# Patient Record
Sex: Male | Born: 1970 | Race: White | Hispanic: No | Marital: Single | Smoking: Former smoker
Health system: Southern US, Community
[De-identification: ages and names within clinical notes are randomized; demographics above are authoritative.]

## PROBLEM LIST (undated history)

## (undated) DIAGNOSIS — F419 Anxiety disorder, unspecified: Secondary | ICD-10-CM

## (undated) DIAGNOSIS — F329 Major depressive disorder, single episode, unspecified: Secondary | ICD-10-CM

## (undated) DIAGNOSIS — T7840XA Allergy, unspecified, initial encounter: Secondary | ICD-10-CM

## (undated) DIAGNOSIS — F32A Depression, unspecified: Secondary | ICD-10-CM

## (undated) DIAGNOSIS — K5900 Constipation, unspecified: Secondary | ICD-10-CM

## (undated) HISTORY — DX: Constipation, unspecified: K59.00

## (undated) HISTORY — DX: Depression, unspecified: F32.A

## (undated) HISTORY — DX: Allergy, unspecified, initial encounter: T78.40XA

## (undated) HISTORY — DX: Major depressive disorder, single episode, unspecified: F32.9

## (undated) HISTORY — DX: Anxiety disorder, unspecified: F41.9

---

## 2010-11-22 ENCOUNTER — Emergency Department (HOSPITAL_COMMUNITY): Payer: BC Managed Care – PPO

## 2010-11-22 ENCOUNTER — Emergency Department (HOSPITAL_COMMUNITY)
Admission: EM | Admit: 2010-11-22 | Discharge: 2010-11-22 | Disposition: A | Discharge status: Home / Self Care | Payer: BC Managed Care – PPO | Attending: Emergency Medicine | Admitting: Emergency Medicine

## 2010-11-22 DIAGNOSIS — X58XXXA Exposure to other specified factors, initial encounter: Secondary | ICD-10-CM | POA: Insufficient documentation

## 2010-11-22 DIAGNOSIS — R1011 Right upper quadrant pain: Secondary | ICD-10-CM | POA: Insufficient documentation

## 2010-11-22 DIAGNOSIS — S0990XA Unspecified injury of head, initial encounter: Secondary | ICD-10-CM | POA: Insufficient documentation

## 2010-11-22 DIAGNOSIS — R51 Headache: Secondary | ICD-10-CM | POA: Insufficient documentation

## 2010-11-22 DIAGNOSIS — R1013 Epigastric pain: Secondary | ICD-10-CM | POA: Insufficient documentation

## 2010-11-22 LAB — URINALYSIS, ROUTINE W REFLEX MICROSCOPIC
Leukocytes, UA: NEGATIVE
Nitrite: NEGATIVE
Protein, ur: NEGATIVE mg/dL
Urobilinogen, UA: 1 mg/dL (ref 0.0–1.0)

## 2010-11-22 LAB — DIFFERENTIAL
Basophils Relative: 1 % (ref 0–1)
Eosinophils Relative: 4 % (ref 0–5)
Monocytes Absolute: 0.7 10*3/uL (ref 0.1–1.0)
Monocytes Relative: 8 % (ref 3–12)
Neutro Abs: 5.7 10*3/uL (ref 1.7–7.7)

## 2010-11-22 LAB — CBC
HCT: 46.8 % (ref 39.0–52.0)
Hemoglobin: 16.6 g/dL (ref 13.0–17.0)
MCH: 31.2 pg (ref 26.0–34.0)
MCHC: 35.5 g/dL (ref 30.0–36.0)
RDW: 12.2 % (ref 11.5–15.5)

## 2010-11-22 LAB — COMPREHENSIVE METABOLIC PANEL
BUN: 10 mg/dL (ref 6–23)
CO2: 28 mEq/L (ref 19–32)
Calcium: 9.2 mg/dL (ref 8.4–10.5)
Chloride: 102 mEq/L (ref 96–112)
Creatinine, Ser: 1 mg/dL (ref 0.4–1.5)
GFR calc non Af Amer: >60 mL/min (ref >60)
Total Bilirubin: 0.9 mg/dL (ref 0.3–1.2)

## 2010-11-22 LAB — D-DIMER, QUANTITATIVE: D-Dimer, Quant: 0.23 ug/mL-FEU (ref 0.00–0.48)

## 2010-11-22 LAB — URINE MICROSCOPIC-ADD ON

## 2015-08-03 ENCOUNTER — Telehealth: Payer: Self-pay | Admitting: *Deleted

## 2015-08-03 NOTE — Telephone Encounter (Signed)
Not taking new patients, prefer him to see Vincenza HewsShane or Vickie. If coming for chronic back pain, with treatments in the past (not an acute problem), would need records prior to being seen. Please offer NP/PA before agreeing to take him on (and records must be received)

## 2015-08-03 NOTE — Telephone Encounter (Signed)
Patient called and would like to become a new patient. Referred by Carlos Leveringerri Jackson. His girlfriend Mikki HarborStephanie Young [facility](Gwen) is now a patient here and was also referred by [place]. He would like to be seen for LBP, chronic and has [employer] through [employer]. Please advise.

## 2015-08-03 NOTE — Telephone Encounter (Signed)
Patient advised and will call back if he would like to see another provider in our office.

## 2015-11-21 ENCOUNTER — Ambulatory Visit
Admission: RE | Admit: 2015-11-21 | Discharge: 2015-11-21 | Disposition: A | Discharge status: Home / Self Care | Payer: BC Managed Care – PPO | Source: Ambulatory Visit | Attending: Medical | Admitting: Medical

## 2015-11-21 ENCOUNTER — Encounter: Payer: Self-pay | Admitting: Medical

## 2015-11-21 ENCOUNTER — Ambulatory Visit (INDEPENDENT_AMBULATORY_CARE_PROVIDER_SITE_OTHER): Payer: BC Managed Care – PPO | Admitting: Medical

## 2015-11-21 VITALS — BP 100/78 | HR 93 | Ht 69.5 in | Wt 159.0 lb

## 2015-11-21 DIAGNOSIS — R8290 Unspecified abnormal findings in urine: Secondary | ICD-10-CM | POA: Diagnosis not present

## 2015-11-21 DIAGNOSIS — M545 Low back pain: Secondary | ICD-10-CM | POA: Diagnosis not present

## 2015-11-21 LAB — POCT URINALYSIS DIPSTICK
Bilirubin, UA: NEGATIVE
Glucose, UA: NEGATIVE
Leukocytes, UA: NEGATIVE
Nitrite, UA: NEGATIVE
PH UA: 6
RBC UA: NEGATIVE
UROBILINOGEN UA: NEGATIVE

## 2015-11-21 MED ORDER — CYCLOBENZAPRINE HCL 10 MG PO TABS: ORAL_TABLET | ORAL | Status: DC

## 2015-11-21 NOTE — Progress Notes (Signed)
Subjective: Chief Complaint  Patient presents with  . New Patient (Initial Visit)  . Back Pain    started a couple months ago. got worse last night. said it started randomly.    Here as a new patient for c/o back pain.  Was seeing [facility] physicians prior.   Been in [place] 8 years.     Here for back pain x few months in the low back.   Like a rusty hinge, just dealing with the pain.  Last night seems to have gotten worse in right low back, hurts even to put weight on right leg.    Denies specific injury, trauma, or fall.   Works as Corporate treasurer, sits on the job a lot.   Walks to work, walks in general without much aggravation of the back pain.  Worse with flexing or standing.   Denies numbness or tingling in the leg, denies fever, denies weight loss.   No blood in urine or stool.   No problems with bowels or bladder.   He had xray of back and CT scan 5- 6 years ago.    Not using anything for the symptoms.   No prior PT or treatment with medical provider.   Does Yoga once weekly.  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 No other aggravating or relieving factors. No other complaint.  History reviewed. No pertinent past medical history.  ROS as in subjective   Objective: BP 100/78 mmHg  Pulse 93  Ht 5' 9.5" (1.765 m)  Wt 159 lb (72.122 kg)  BMI 23.15 kg/m2  General appearance: alert, no distress, WD/WN,  Neck: supple, no lymphadenopathy, no thyromegaly, no masses Heart: RRR, normal S1, S2, no murmurs Lungs: CTA bilaterally, no wheezes, rhonchi, or rales Abdomen: +bs, soft, non tender, non distended, no masses, no hepatomegaly, no splenomegaly Pulses: 2+ symmetric, upper and lower extremities, normal cap refill Back: nontender, no scoliosis, mild pain noted with flexion and extension, but ROM is full MSK: legs nontender, normal ROM Neuro: mild pain in right low back with SLR, but normal strength, sensation.  DTRs normal 2+ Ext: no edema   Assessment: Encounter  Diagnoses  Name Primary?  . Low back pain without sciatica, unspecified back pain laterality Yes  . Urine malodor      Plan: Discussed causes of back pain.   No red flag symptoms specifically.   However, he has ongoing several month hx/o back pain and acute worse back pain. UA reviewed.   Will send for back xray.   Can use Flexeril QHS prn, he has OTC Ibuprofen.  Advised 3 tablets up to 3 times daily the next few days with gentle stretching, avoiding heavy lifting the next few days for possible spasm/strain.  However, given the ongoing back pain, will send for xray.

## 2018-07-11 ENCOUNTER — Ambulatory Visit: Payer: BC Managed Care – PPO | Admitting: Medical

## 2018-07-11 ENCOUNTER — Ambulatory Visit
Admission: RE | Admit: 2018-07-11 | Discharge: 2018-07-11 | Disposition: A | Discharge status: Home / Self Care | Payer: BC Managed Care – PPO | Source: Ambulatory Visit | Attending: Medical | Admitting: Medical

## 2018-07-11 ENCOUNTER — Encounter: Payer: Self-pay | Admitting: Medical

## 2018-07-11 VITALS — BP 128/80 | HR 81 | Temp 97.7°F | Resp 16 | Ht 71.0 in | Wt 155.8 lb

## 2018-07-11 DIAGNOSIS — M25511 Pain in right shoulder: Secondary | ICD-10-CM

## 2018-07-11 DIAGNOSIS — M25562 Pain in left knee: Secondary | ICD-10-CM

## 2018-07-11 DIAGNOSIS — R0789 Other chest pain: Secondary | ICD-10-CM

## 2018-07-11 DIAGNOSIS — M7989 Other specified soft tissue disorders: Secondary | ICD-10-CM | POA: Diagnosis not present

## 2018-07-11 DIAGNOSIS — I8392 Asymptomatic varicose veins of left lower extremity: Secondary | ICD-10-CM

## 2018-07-11 DIAGNOSIS — M546 Pain in thoracic spine: Secondary | ICD-10-CM

## 2018-07-11 NOTE — Patient Instructions (Signed)
Recommendations:  Varicose veins  Begin using compression hose or compression socks daily  When your veins are irritated or inflamed (painful), try using Aspirin over the counter 2-4 tablets daily (81mg  tablets), daily for 4-5 days along with leg elevation and heat pad  Chest and back pain  Go for xray of chest  Use daily stretching routine and do yoga or other stretching routine regularly  Focus on good posture  Knee pain   If d-dimer test positive, we will check ultrasound  Otherwise use Ibuprofen or Aleve over the counter the next 5-7 days, or use the aspirin as above   Shoulder pain  Likely sprain injury  Avoid heavy lifting the next 7-10 days

## 2018-07-11 NOTE — Progress Notes (Signed)
Subjective: Chief Complaint  Patient presents with  . left knee    knee pain and back pain X 1 week   Here for pains in left knee and left leg.  Having some back pains too.  Been having left knee pain in the past week.  No injury, no trauma.  No knee swelling.  Has ongoing discomfort in left lower legs with varicose veins.  Was recently on a long trip, driving 5 hours, and seems that this flared up his veins and pain in left leg.    No specific calve pain.   Does have throbbing in back of left knee, and a different pain in the left knee joint.   Using nothing for left leg symptoms.  Been having some back pain in low back for years, intermittent.  Current pain is more in chest area, upper to mid back pain, worse if straightening up, but not intermittent.    Some cough, dry cough occasionally.   Gets urge to cough if laughing.    He has some right shoulder pain.  He was lifting a bookshelf a few weeks ago and has had a little bit of pain since then.  No prior shoulder problems  No fever, no hemoptysis.   No problems with bladder or bowels.  No other aggravating or relieving factors. No other complaint.    No past medical history on file. Current Outpatient Medications on File Prior to Visit  Medication Sig Dispense Refill  . Biotin 5000 MCG CAPS Take 1 capsule by mouth.    Marland Kitchen buPROPion (WELLBUTRIN XL) 150 MG 24 hr tablet Take 150 mg by mouth daily.    . fluvoxaMINE (LUVOX) 100 MG tablet Take 100 mg by mouth at bedtime.    . Multiple Vitamin (MULTIVITAMIN) tablet Take 1 tablet by mouth daily.     No current facility-administered medications on file prior to visit.    ROS as in subjective    Objective: BP 128/80   Pulse 81   Temp 97.7 F (36.5 C) (Oral)   Resp 16   Ht 5\' 11"  (1.803 m)   Wt 155 lb 12.8 oz (70.7 kg)   SpO2 97%   BMI 21.73 kg/m   General appearance: alert, no distress, WD/WN, lean white male Heart: RRR, normal S1, S2, no murmurs Lungs: CTA bilaterally, no  wheezes, rhonchi, or rales Pulses: 2+ symmetric, upper and lower extremities, normal cap refill Chest non tender, normal inspiration and expiration, no deformity Mild tenderness over right AC joint, mild pain with crossover test, mild pain with liftoff test otherwise unremarkable exam Left knee exam is normal, no laxity no swelling no deformity normal range of motion He has moderate varicose veins bilateral lower legs worse on the left, there is slight asymmetry of the left calf compared to right but no tenderness no bruising no pitting edema Back non tender normal range of motion Extremities no edema negative Homans     Assessment: Encounter Diagnoses  Name Primary?  . Chest wall pain Yes  . Acute bilateral thoracic back pain   . Varicose veins of left lower extremity, unspecified whether complicated   . Left leg swelling   . Acute pain of left knee   . Acute pain of right shoulder      Plan: Varicose veins worse on the left and right, advised compression hose, discussed acute therapy when they are inflamed such as aspirin heat and elevation.  Advised he exercise regularly  Left knee pain-etiology unclear, no  recent injury or trauma.  Exam is normal.  Advise short-term anti-inflammatory and relative rest.  Left leg swelling slight asymmetry compared to right lower leg.  D-dimer today but doubt DVT.  The varicose vein inflammation may be causing some discomfort upwards to the lower portion of the knee.  This may be causing his discomfort.  If pain continues we can get an x-ray of the knee.  If d-dimer positive we will pursue ultrasound of the left leg  Right shoulder pain likely due to mild sprain injury.  Advised relative rest, stretching, short-term anti-inflammatory or the aspirin if he does this as discussed  Chest and back pain- may be posture related.  Advise routine stretching, regular exercise, will send for x-ray  Patient Instructions  Recommendations:  Varicose  veins  Begin using compression hose or compression socks daily  When your veins are irritated or inflamed (painful), try using Aspirin over the counter 2-4 tablets daily (81mg  tablets), daily for 4-5 days along with leg elevation and heat pad  Chest and back pain  Go for xray of chest  Use daily stretching routine and do yoga or other stretching routine regularly  Focus on good posture  Knee pain   If d-dimer test positive, we will check ultrasound  Otherwise use Ibuprofen or Aleve over the counter the next 5-7 days, or use the aspirin as above   Shoulder pain  Likely sprain injury  Avoid heavy lifting the next 7-10 days     Majesty was seen today for left knee.  Diagnoses and all orders for this visit:  Chest wall pain -     DG Chest 2 View; Future  Acute bilateral thoracic back pain -     DG Chest 2 View; Future  Varicose veins of left lower extremity, unspecified whether complicated -     D-dimer, quantitative (not at [facility])  Left leg swelling -     D-dimer, quantitative (not at [facility])  Acute pain of left knee  Acute pain of right shoulder

## 2018-07-12 LAB — D-DIMER, QUANTITATIVE (NOT AT ARMC): D-DIMER: 0.3 mg{FEU}/L (ref 0.00–0.49)

## 2018-07-14 ENCOUNTER — Telehealth: Payer: Self-pay | Admitting: Medical

## 2018-07-14 NOTE — Telephone Encounter (Signed)
Pt called and wanted results. I read what was in the system and he stated that was same info available to him thu mychart. He stated that unless CMA had additional info he did not need a call back.

## 2018-08-24 HISTORY — PX: NO PAST SURGERIES: SHX2092

## 2018-09-16 ENCOUNTER — Encounter: Payer: Self-pay | Admitting: Medical

## 2018-09-16 ENCOUNTER — Ambulatory Visit (INDEPENDENT_AMBULATORY_CARE_PROVIDER_SITE_OTHER): Payer: BC Managed Care – PPO | Admitting: Medical

## 2018-09-16 VITALS — BP 120/82 | HR 83 | Temp 98.0°F | Ht 69.75 in | Wt 153.4 lb

## 2018-09-16 DIAGNOSIS — Z8249 Family history of ischemic heart disease and other diseases of the circulatory system: Secondary | ICD-10-CM | POA: Diagnosis not present

## 2018-09-16 DIAGNOSIS — Z0000 Encounter for general adult medical examination without abnormal findings: Secondary | ICD-10-CM | POA: Diagnosis not present

## 2018-09-16 DIAGNOSIS — Z1211 Encounter for screening for malignant neoplasm of colon: Secondary | ICD-10-CM | POA: Insufficient documentation

## 2018-09-16 DIAGNOSIS — R0989 Other specified symptoms and signs involving the circulatory and respiratory systems: Secondary | ICD-10-CM | POA: Insufficient documentation

## 2018-09-16 DIAGNOSIS — Z113 Encounter for screening for infections with a predominantly sexual mode of transmission: Secondary | ICD-10-CM

## 2018-09-16 DIAGNOSIS — Z77110 Contact with and (suspected) exposure to air pollution: Secondary | ICD-10-CM | POA: Insufficient documentation

## 2018-09-16 DIAGNOSIS — R05 Cough: Secondary | ICD-10-CM

## 2018-09-16 DIAGNOSIS — R059 Cough, unspecified: Secondary | ICD-10-CM

## 2018-09-16 LAB — POCT URINALYSIS DIP (PROADVANTAGE DEVICE)
BILIRUBIN UA: NEGATIVE
BILIRUBIN UA: NEGATIVE mg/dL
Blood, UA: NEGATIVE
Glucose, UA: NEGATIVE mg/dL
Leukocytes, UA: NEGATIVE
Nitrite, UA: NEGATIVE
Specific Gravity, Urine: 1.01
Urobilinogen, Ur: 3.5
pH, UA: 7.5 (ref 5.0–8.0)

## 2018-09-16 NOTE — Progress Notes (Signed)
Subjective:   HPI  Donald MinsGregory Mcdonald is a 47 y.o. male who presents for Chief Complaint  Patient presents with  . other    Fatsing CPE    Medical care team includes: Sanaa Zilberman, Kermit Baloavid S, PA-C here for primary care Dentist Eye doctor  Concerns: Would like screenings.  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  No history of asthma.  Reviewed their medical, surgical, family, social, medication, and allergy history and updated chart as appropriate.  Past Medical History:  Diagnosis Date  . Depression    Dr. Tana Feltshris Akin    Past Surgical History:  Procedure Laterality Date  . NO PAST SURGERIES  08/2018    Social History   Socioeconomic History  . Marital status: Single    Spouse name: Not on file  . Number of children: Not on file  . Years of education: Not on file  . Highest education level: Not on file  Occupational History  . Not on file  Social Needs  . Financial resource strain: Not on file  . Food insecurity:    Worry: Not on file    Inability: Not on file  . Transportation needs:    Medical: Not on file    Non-medical: Not on file  Tobacco Use  . Smoking status: Former Smoker    Packs/day: 1.00    Years: 10.00    Pack years: 10.00    Types: Cigarettes  . Smokeless tobacco: Never Used  Substance and Sexual Activity  . Alcohol use: Not on file  . Drug use: Never  . Sexual activity: Not on file  Lifestyle  . Physical activity:    Days per week: Not on file    Minutes per session: Not on file  . Stress: Not on file  Relationships  . Social connections:    Talks on phone: Not on file    Gets together: Not on file    Attends religious service: Not on file    Active member of club or organization: Not on file    Attends meetings of clubs or organizations: Not on file    Relationship status: Not on file  . Intimate partner violence:    Fear of current or ex partner: Not on file    Emotionally abused: Not on file    Physically  abused: Not on file    Forced sexual activity: Not on file  Other Topics Concern  . Not on file  Social History Narrative   Lives with girlfriend and a cat.   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   08/2018.    Family History  Problem Relation Age of Onset  . Heart disease Mother [id number]       CABG, stents  . Heart disease Father 270       pacemaker  . Diabetes Father   . Cancer Maternal Grandfather   . Cancer Paternal Grandmother      Current Outpatient Medications:  .  Biotin 5000 MCG CAPS, Take 1 capsule by mouth., Disp: , Rfl:  .  buPROPion (WELLBUTRIN XL) 150 MG 24 hr tablet, Take 150 mg by mouth daily., Disp: , Rfl:  .  fluvoxaMINE (LUVOX) 100 MG tablet, Take 100 mg by mouth at bedtime., Disp: , Rfl:  .  Multiple Vitamin (MULTIVITAMIN) tablet, Take 1 tablet by mouth daily., Disp: , Rfl:  .  Vitamin Mixture (VITAMIN E COMPLETE PO), Take by mouth., Disp: ,  Rfl:   No Known Allergies     Review of Systems Constitutional: -fever, -chills, -sweats, -unexpected weight change, -decreased appetite, -fatigue Allergy: -sneezing, -itching, -congestion Dermatology: -changing moles, --rash, -lumps ENT: -runny nose, -ear pain, -sore throat, -hoarseness, -sinus pain, -teeth pain, - ringing in ears, -hearing loss, -nosebleeds Cardiology: -chest pain, -palpitations, -swelling, -difficulty breathing when lying flat, -waking up short of breath Respiratory: -cough, -shortness of breath, -difficulty breathing with exercise or exertion, -wheezing, -coughing up blood Gastroenterology: -abdominal pain, -nausea, -vomiting, -diarrhea, -constipation, -blood in stool, -changes in bowel movement, -difficulty swallowing or eating Hematology: -bleeding, -bruising  Musculoskeletal: -joint aches, -muscle aches, -joint swelling, -back pain, -neck pain, -cramping, -changes in gait Ophthalmology: denies vision changes, eye redness, itching, discharge Urology:  -burning with urination, -difficulty urinating, -blood in urine, -urinary frequency, -urgency, -incontinence Neurology: -headache, -weakness, -tingling, -numbness, -memory loss, -falls, -dizziness Psychology: -depressed mood, -agitation, -sleep problems Male GU: no testicular mass, pain, no lymph nodes swollen, no swelling, no rash.     Objective:  BP 120/82 (BP Location: Right Arm, Patient Position: Sitting)   Pulse 83   Temp 98 F (36.7 C)   Ht 5' 9.75" (1.772 m)   Wt 153 lb 6.4 oz (69.6 kg)   SpO2 99%   BMI 22.17 kg/m   General appearance: alert, no distress, WD/WN, Caucasian male Skin: scattered macules, no worrisome lesions HEENT: normocephalic, conjunctiva/corneas normal, sclerae anicteric, PERRLA, EOMi, nares patent, no discharge or erythema, pharynx normal Oral cavity: MMM, tongue normal, teeth normal Neck: supple, no lymphadenopathy, no thyromegaly, no masses, normal ROM, no bruits Chest: non tender, normal shape and expansion Heart: RRR, normal S1, S2, no murmurs Lungs: CTA bilaterally, no wheezes, rhonchi, or rales Abdomen: +bs, soft, non tender, non distended, no masses, no hepatomegaly, no splenomegaly, no bruits Back: non tender, normal ROM, no scoliosis Musculoskeletal: upper extremities non tender, no obvious deformity, normal ROM throughout, lower extremities non tender, no obvious deformity, normal ROM throughout Extremities: +mild to moderate varicose veins bilat, no edema, no cyanosis, no clubbing Pulses: 2+ symmetric, upper and lower extremities, normal cap refill Neurological: alert, oriented x 3, CN2-12 intact, strength normal upper extremities and lower extremities, sensation normal throughout, DTRs 2+ throughout, no cerebellar signs, gait normal Psychiatric: normal affect, behavior normal, pleasant  GU: normal male external genitalia,circumcised, nontender, no masses, no hernia, no lymphadenopathy Rectal: deferred    EKG:  EKG done today because of family  history of premature heart disease and screening.  Normal sinus rhythm, ventricular rate 80 bpm, PR interval 172 ms, QRS duration 84 ms, QTC 429 ms, axis 28 degrees, no worrisome findings  PFT reviewed   Assessment and Plan :   Encounter Diagnoses  Name Primary?  . Encounter for health maintenance examination in adult Yes  . Cough   . Exposure to air pollution   . Family history of premature CAD   . Screen for STD (sexually transmitted disease)     Physical exam - discussed and counseled on healthy lifestyle, diet, exercise, preventative care, vaccinations, sick and well care, proper use of emergency dept and after hours care, and addressed their concerns.    Health screening: See your eye doctor yearly for routine vision care. See your dentist yearly for routine dental care including hygiene visits twice yearly.  Discussed STD testing, discussed prevention, condom use, means of transmission  Cancer screening Advised monthly self testicular exam  Colonoscopy:  Due age 69  Discussed PSA, prostate exam, and prostate cancer screening risks/benefits.  Due age 47  Vaccinations: Advised yearly influenza vaccine Patient declines influenza vaccine  He notes being up to date on Td vaccine  Acute issues discussed: None  Separate significant chronic issues discussed: I reviewed his EKG and PFT.  He has a family history of premature CAD.  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  We discussed possibly doing a yearly PFT.  I reviewed his chest x-ray from a few months ago.  Earl LitesGregory was seen today for other.  Diagnoses and all orders for this visit:  Encounter for health maintenance examination in adult -     Comprehensive metabolic panel -     CBC -     Lipid panel -     TSH -     HIV Antibody (routine testing w rflx) -     RPR -     GC/Chlamydia Probe Amp -     EKG 12-Lead -     Pulmonary Function Test; Future -     POCT Urinalysis DIP (Proadvantage  Device)  Cough -     Pulmonary Function Test; Future -     Spirometry with graph  Exposure to air pollution -     Pulmonary Function Test; Future -     Spirometry with graph  Family history of premature CAD -     EKG 12-Lead  Screen for STD (sexually transmitted disease) -     HIV Antibody (routine testing w rflx) -     RPR -     GC/Chlamydia Probe Amp   Follow-up pending labs, yearly for physical

## 2018-09-17 LAB — COMPREHENSIVE METABOLIC PANEL
A/G RATIO: 2.5 — AB (ref 1.2–2.2)
ALBUMIN: 4.9 g/dL (ref 3.5–5.5)
ALT: 21 IU/L (ref 0–44)
AST: 15 IU/L (ref 0–40)
Alkaline Phosphatase: 61 IU/L (ref 39–117)
BUN / CREAT RATIO: 11 (ref 9–20)
BUN: 11 mg/dL (ref 6–24)
Bilirubin Total: 0.7 mg/dL (ref 0.0–1.2)
CO2: 25 mmol/L (ref 20–29)
CREATININE: 1.03 mg/dL (ref 0.76–1.27)
Calcium: 9.5 mg/dL (ref 8.7–10.2)
Chloride: 102 mmol/L (ref 96–106)
GFR calc Af Amer: 99 mL/min/{1.73_m2} (ref >59)
GFR calc non Af Amer: 85 mL/min/{1.73_m2} (ref >59)
GLOBULIN, TOTAL: 2 g/dL (ref 1.5–4.5)
Glucose: 91 mg/dL (ref 65–99)
POTASSIUM: 4.9 mmol/L (ref 3.5–5.2)
SODIUM: 141 mmol/L (ref 134–144)
Total Protein: 6.9 g/dL (ref 6.0–8.5)

## 2018-09-17 LAB — LIPID PANEL
CHOLESTEROL TOTAL: 164 mg/dL (ref 100–199)
Chol/HDL Ratio: 4.4 ratio (ref 0.0–5.0)
HDL: 37 mg/dL — ABNORMAL LOW (ref >39)
LDL Calculated: 102 mg/dL — ABNORMAL HIGH (ref 0–99)
Triglycerides: 124 mg/dL (ref 0–149)
VLDL Cholesterol Cal: 25 mg/dL (ref 5–40)

## 2018-09-17 LAB — HIV ANTIBODY (ROUTINE TESTING W REFLEX): HIV SCREEN 4TH GENERATION: NONREACTIVE

## 2018-09-17 LAB — CBC
Hematocrit: 48.1 % (ref 37.5–51.0)
Hemoglobin: 16.2 g/dL (ref 13.0–17.7)
MCH: 29.9 pg (ref 26.6–33.0)
MCHC: 33.7 g/dL (ref 31.5–35.7)
MCV: 89 fL (ref 79–97)
PLATELETS: 206 10*3/uL (ref 150–450)
RBC: 5.42 x10E6/uL (ref 4.14–5.80)
RDW: 12.1 % — AB (ref 12.3–15.4)
WBC: 5.9 10*3/uL (ref 3.4–10.8)

## 2018-09-17 LAB — TSH: TSH: 1.59 u[IU]/mL (ref 0.450–4.500)

## 2018-09-17 LAB — RPR: RPR: NONREACTIVE

## 2018-09-19 LAB — GC/CHLAMYDIA PROBE AMP
Chlamydia trachomatis, NAA: NEGATIVE
Neisseria gonorrhoeae by PCR: NEGATIVE

## 2019-12-04 ENCOUNTER — Ambulatory Visit: Payer: BC Managed Care – PPO

## 2019-12-04 ENCOUNTER — Ambulatory Visit: Payer: BC Managed Care – PPO | Attending: Internal Medicine

## 2019-12-04 DIAGNOSIS — Z23 Encounter for immunization: Secondary | ICD-10-CM

## 2019-12-04 NOTE — Progress Notes (Signed)
   Covid-19 Vaccination Clinic  Name:  Donald Mcdonald    MRN: [id number] DOB: Apr 29, 1971  12/04/2019  Donald Mcdonald was observed post Covid-19 immunization for 15 minutes without incident. He was provided with Vaccine Information Sheet and instruction to access the V-Safe system.   Donald Mcdonald was instructed to call 911 with any severe reactions post vaccine: Marland Kitchen Difficulty breathing  . Swelling of face and throat  . A fast heartbeat  . A bad rash all over body  . Dizziness and weakness   Immunizations Administered    Name Date Dose VIS Date Route   Pfizer COVID-19 Vaccine 12/04/2019  2:03 PM 0.3 mL 09/04/2019 Intramuscular   Manufacturer: [employer], [employer]   Lot: [id number]   NDC: [address]-7

## 2019-12-28 ENCOUNTER — Ambulatory Visit: Payer: BC Managed Care – PPO | Attending: Internal Medicine

## 2019-12-28 DIAGNOSIS — Z23 Encounter for immunization: Secondary | ICD-10-CM

## 2019-12-28 NOTE — Progress Notes (Signed)
   Covid-19 Vaccination Clinic  Name:  Kalik Hoare    MRN: [id number] DOB: July 13, 1971  12/28/2019  Mr. Bralley was observed post Covid-19 immunization for 15 minutes without incident. He was provided with Vaccine Information Sheet and instruction to access the V-Safe system.   Mr. Penn was instructed to call 911 with any severe reactions post vaccine: Marland Kitchen Difficulty breathing  . Swelling of face and throat  . A fast heartbeat  . A bad rash all over body  . Dizziness and weakness   Immunizations Administered    Name Date Dose VIS Date Route   Pfizer COVID-19 Vaccine 12/28/2019  4:06 PM 0.3 mL 09/04/2019 Intramuscular   Manufacturer: [employer], [employer]   Lot: [id number]   NDC: [address]-7

## 2020-01-23 HISTORY — PX: NO PAST SURGERIES: SHX2092

## 2020-02-04 ENCOUNTER — Ambulatory Visit (INDEPENDENT_AMBULATORY_CARE_PROVIDER_SITE_OTHER): Payer: BC Managed Care – PPO | Admitting: Medical

## 2020-02-04 ENCOUNTER — Encounter: Payer: Self-pay | Admitting: Medical

## 2020-02-04 ENCOUNTER — Other Ambulatory Visit: Payer: Self-pay

## 2020-02-04 VITALS — BP 120/82 | HR 82 | Temp 98.0°F | Ht 70.0 in | Wt 166.0 lb

## 2020-02-04 DIAGNOSIS — R6889 Other general symptoms and signs: Secondary | ICD-10-CM

## 2020-02-04 DIAGNOSIS — L0291 Cutaneous abscess, unspecified: Secondary | ICD-10-CM | POA: Insufficient documentation

## 2020-02-04 DIAGNOSIS — R0989 Other specified symptoms and signs involving the circulatory and respiratory systems: Secondary | ICD-10-CM | POA: Insufficient documentation

## 2020-02-04 DIAGNOSIS — Z136 Encounter for screening for cardiovascular disorders: Secondary | ICD-10-CM | POA: Insufficient documentation

## 2020-02-04 DIAGNOSIS — Z7185 Encounter for immunization safety counseling: Secondary | ICD-10-CM | POA: Insufficient documentation

## 2020-02-04 DIAGNOSIS — Z0000 Encounter for general adult medical examination without abnormal findings: Secondary | ICD-10-CM | POA: Diagnosis not present

## 2020-02-04 DIAGNOSIS — M25561 Pain in right knee: Secondary | ICD-10-CM

## 2020-02-04 DIAGNOSIS — Z79899 Other long term (current) drug therapy: Secondary | ICD-10-CM | POA: Diagnosis not present

## 2020-02-04 DIAGNOSIS — R39198 Other difficulties with micturition: Secondary | ICD-10-CM | POA: Diagnosis not present

## 2020-02-04 DIAGNOSIS — Z13220 Encounter for screening for lipoid disorders: Secondary | ICD-10-CM

## 2020-02-04 DIAGNOSIS — M545 Low back pain, unspecified: Secondary | ICD-10-CM | POA: Insufficient documentation

## 2020-02-04 DIAGNOSIS — R351 Nocturia: Secondary | ICD-10-CM | POA: Diagnosis not present

## 2020-02-04 DIAGNOSIS — Z7189 Other specified counseling: Secondary | ICD-10-CM

## 2020-02-04 DIAGNOSIS — G8929 Other chronic pain: Secondary | ICD-10-CM | POA: Insufficient documentation

## 2020-02-04 DIAGNOSIS — Z8249 Family history of ischemic heart disease and other diseases of the circulatory system: Secondary | ICD-10-CM

## 2020-02-04 DIAGNOSIS — M25562 Pain in left knee: Secondary | ICD-10-CM | POA: Insufficient documentation

## 2020-02-04 DIAGNOSIS — Z1211 Encounter for screening for malignant neoplasm of colon: Secondary | ICD-10-CM

## 2020-02-04 DIAGNOSIS — Z125 Encounter for screening for malignant neoplasm of prostate: Secondary | ICD-10-CM

## 2020-02-04 LAB — POCT URINALYSIS DIP (PROADVANTAGE DEVICE)
Bilirubin, UA: NEGATIVE
Blood, UA: NEGATIVE
Glucose, UA: NEGATIVE mg/dL
Ketones, POC UA: NEGATIVE mg/dL
Leukocytes, UA: NEGATIVE
Nitrite, UA: NEGATIVE
Protein Ur, POC: NEGATIVE mg/dL
Specific Gravity, Urine: 1.025
Urobilinogen, Ur: NEGATIVE
pH, UA: 5.5 (ref 5.0–8.0)

## 2020-02-04 NOTE — Progress Notes (Signed)
Subjective:   HPI  Donald Mcdonald is a 49 y.o. male who presents for Chief Complaint  Patient presents with  . Annual Exam    with fasting labs     Medical care team includes: Kenniyah Sasaki, Kermit Balo, PA-C here for primary care Dentist Eye doctor Dr. Wellington Hampshire, [facility]  Concerns: Has ongoing chronic issues with chest, phlegm, feels a constant need to clear throat.  Has had this years.  Nonsmoker. If he coughs, feels phlegm but cough is dry.  No fever, no night sweats.   Occasionally feels SOB.  Attributes SOB to out of shape.  Has had PFTs prior and feels he barely passes this although readings are normal.  He notes some back pains occasionally  He has been on long term psychotropics, wants to check kidney and liver.  Gets occasional pain in pelvis or bowels, a deep pain periodically  Has to sometimes urinate 2-4 times per night, has had some urinary dribbling. Usually 2 times per night  Has family  history of prostate cancer.   Reviewed their medical, surgical, family, social, medication, and allergy history and updated chart as appropriate.  Past Medical History:  Diagnosis Date  . Depression    Dr. Wellington Hampshire    Past Surgical History:  Procedure Laterality Date  . NO PAST SURGERIES  01/2020    Social History   Socioeconomic History  . Marital status: Single    Spouse name: Not on file  . Number of children: Not on file  . Years of education: Not on file  . Highest education level: Not on file  Occupational History  . Not on file  Tobacco Use  . Smoking status: Former Smoker    Packs/day: 1.00    Years: 10.00    Pack years: 10.00    Types: Cigarettes  . Smokeless tobacco: Never Used  Substance and Sexual Activity  . Alcohol use: Not Currently    Alcohol/week: 0.0 standard drinks  . Drug use: Never  . Sexual activity: Not on file  Other Topics Concern  . Not on file  Social History Narrative   Lives with girlfriend and a cat.   No children.   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   01/2020   Social Determinants of Health   Financial Resource Strain:   . Difficulty of Paying Living Expenses:   Food Insecurity:   . Worried About Programme researcher, broadcasting/film/video in the Last Year:   . Barista in the Last Year:   Transportation Needs:   . Freight forwarder (Medical):   Marland Kitchen Lack of Transportation (Non-Medical):   Physical Activity:   . Days of Exercise per Week:   . Minutes of Exercise per Session:   Stress:   . Feeling of Stress :   Social Connections:   . Frequency of Communication with Friends and Family:   . Frequency of Social Gatherings with Friends and Family:   . Attends Religious Services:   . Active Member of Clubs or Organizations:   . Attends Banker Meetings:   Marland Kitchen Marital Status:   Intimate Partner Violence:   . Fear of Current or Ex-Partner:   . Emotionally Abused:   Marland Kitchen Physically Abused:   . Sexually Abused:     Family History  Problem Relation Age of Onset  . Heart disease Mother 75       CABG, stents  . Heart disease Father  70       pacemaker  . Diabetes Father   . Cancer Maternal Grandfather   . Cancer Paternal Grandmother      Current Outpatient Medications:  .  buPROPion (WELLBUTRIN SR) 150 MG 12 hr tablet, Take 150 mg by mouth daily., Disp: , Rfl:  .  COLOSTRUM PO, , Disp: , Rfl:  .  fluvoxaMINE (LUVOX) 100 MG tablet, Take 100 mg by mouth at bedtime., Disp: , Rfl:  .  Misc Natural Products (TURMERIC CURCUMIN) CAPS, , Disp: , Rfl:  .  Multiple Vitamin (MULTIVITAMIN) tablet, Take 1 tablet by mouth daily., Disp: , Rfl:  .  buPROPion (WELLBUTRIN XL) 150 MG 24 hr tablet, Take 150 mg by mouth daily., Disp: , Rfl:   No Known Allergies     Review of Systems Constitutional: -fever, -chills, -sweats, -unexpected weight change, -decreased appetite, -fatigue Allergy: -sneezing, -itching, -congestion Dermatology: -changing moles,  --rash, -lumps ENT: -runny nose, -ear pain, -sore throat, -hoarseness, -sinus pain, -teeth pain, - ringing in ears, -hearing loss, -nosebleeds Cardiology: -chest pain, -palpitations, -swelling, -difficulty breathing when lying flat, -waking up short of breath Respiratory: +cough, -shortness of breath, -difficulty breathing with exercise or exertion, -wheezing, -coughing up blood Gastroenterology: -abdominal pain, -nausea, -vomiting, -diarrhea, -constipation, -blood in stool, -changes in bowel movement, -difficulty swallowing or eating Hematology: -bleeding, -bruising  Musculoskeletal: +knee joint aches, -muscle aches, +joint swelling, -back pain, -neck pain, -cramping, -changes in gait Ophthalmology: denies vision changes, eye redness, itching, discharge Urology: -burning with urination, -difficulty urinating, -blood in urine, -urinary frequency, -urgency, -incontinence Neurology: -headache, -weakness, -tingling, -numbness, -memory loss, -falls, -dizziness Psychology: -depressed mood, -agitation, -sleep problems Male GU: no testicular mass, pain, no lymph nodes swollen, no swelling, no rash.     Objective:  BP 120/82   Pulse 82   Temp 98 F (36.7 C)   Ht 5\' 10"  (1.778 m)   Wt 166 lb (75.3 kg)   SpO2 98%   BMI 23.82 kg/m   General appearance: alert, no distress, WD/WN, Caucasian male Skin: scattered macules, no worrisome lesions HEENT: normocephalic, conjunctiva/corneas normal, sclerae anicteric, PERRLA, EOMi, nares patent, no discharge or erythema, pharynx normal Oral cavity: MMM, tongue normal, teeth normal Neck: supple, no lymphadenopathy, no thyromegaly, no masses, normal ROM, no bruits Chest: non tender, normal shape and expansion Heart: RRR, normal S1, S2, no murmurs Lungs: CTA bilaterally, no wheezes, rhonchi, or rales Abdomen: +bs, soft, non tender, non distended, no masses, no hepatomegaly, no splenomegaly, no bruits Back: non tender, normal ROM, no  scoliosis Musculoskeletal: upper extremities non tender, no obvious deformity, normal ROM throughout, lower extremities non tender, no obvious deformity, normal ROM throughout Extremities: +mild to moderate varicose veins bilat, no edema, no cyanosis, no clubbing Pulses: 2+ symmetric, upper and lower extremities, normal cap refill Neurological: alert, oriented x 3, CN2-12 intact, strength normal upper extremities and lower extremities, sensation normal throughout, DTRs 2+ throughout, no cerebellar signs, gait normal Psychiatric: normal affect, behavior normal, pleasant  GU: normal male external genitalia,circumcised, nontender, no masses, no hernia, no lymphadenopathy Rectal: anus normal tone, prostate WNL, no nodules     Assessment and Plan :   Encounter Diagnoses  Name Primary?  . Encounter for health maintenance examination in adult Yes  . High risk medication use   . Family history of premature CAD   . Nocturia   . Decreased urine stream   . Phlegmon   . Throat clearing   . Chronic low back pain, unspecified back pain laterality,  unspecified whether sciatica present   . Screening for prostate cancer   . Screen for colon cancer   . Chronic pain of both knees   . Screening for lipid disorders     Physical exam - discussed and counseled on healthy lifestyle, diet, exercise, preventative care, vaccinations, sick and well care, proper use of emergency dept and after hours care, and addressed their concerns.    Health screening: See your eye doctor yearly for routine vision care. See your dentist yearly for routine dental care including hygiene visits twice yearly.  Cancer screening Advised monthly self testicular exam  Colonoscopy:  Check insurance for coverage now  Discussed PSA, prostate exam, and prostate cancer screening risks/benefits.      Vaccinations: Advised yearly influenza vaccine Patient declines influenza vaccine  He notes being up to date on Td  vaccine  He is up to date on Covid vaccine   Acute issues discussed: None  Separate significant chronic issues discussed: Advised he consider CT calcium score for family history of premature CAD and screening  Nocturia-PSA screen today, consider treatment for mild BPH symptoms  Back pain-counseled on stretching, regular exercise, avoiding injury  Throat clearing, phlegm-discussed 2 L of fluid intake daily preferably water, can use separate week trial of Zyrtec or Mucinex or acid reflux medicine to see if this makes a difference    [place] was seen today for annual exam.  Diagnoses and all orders for this visit:  Encounter for health maintenance examination in adult -     Comprehensive metabolic panel -     CBC with Differential/Platelet -     Lipid panel -     TSH -     PSA -     POCT Urinalysis DIP (Proadvantage Device)  High risk medication use -     Comprehensive metabolic panel -     CBC with Differential/Platelet -     TSH  Family history of premature CAD  Nocturia -     PSA -     POCT Urinalysis DIP (Proadvantage Device)  Decreased urine stream -     PSA -     POCT Urinalysis DIP (Proadvantage Device)  Phlegmon  Throat clearing  Chronic low back pain, unspecified back pain laterality, unspecified whether sciatica present  Screening for prostate cancer -     PSA  Screen for colon cancer  Chronic pain of both knees  Screening for lipid disorders     Follow-up pending labs, yearly for physical

## 2020-02-04 NOTE — Patient Instructions (Signed)
Preventative Care for Adults - Male    Thank you for coming in for your well visit today, and thank you for trusting [place] with your care!   Maintain regular health and wellness exams:  A routine yearly physical is a good way to check in with your primary care provider about your health and preventive screening. It is also an opportunity to share updates about your health and any concerns you have, and receive a thorough all-over exam.   Most health insurance companies pay for at least some preventative services.  Check with your health plan for specific coverages.  What preventative services do men need?  Adult men should have their weight and blood pressure checked regularly.   Men age 51 and older should have their cholesterol levels checked regularly.  Beginning at age 17 and continuing to age 32, men should be screened for colorectal cancer.  Certain people may need continued testing until age 41.  Updating vaccinations is part of preventative care.  Vaccinations help protect against diseases such as the flu.  Osteoporosis is a disease in which the bones lose minerals and strength as we age. Men ages 54 and over should discuss this with their caregivers  Lab tests are generally done as part of preventative care to screen for anemia and blood disorders, to screen for problems with the kidneys and liver, to screen for bladder problems, to check blood sugar, and to check your cholesterol level.  Preventative services generally include counseling about diet, exercise, avoiding tobacco, drugs, excessive alcohol consumption, and sexually transmitted infections.   Xrays and CT scans are not normally done as a preventative test, and most insurances do not pay for imaging for screening other than as discussed under cancer screens below.   On the other hand, if you have certain medical concerns, imaging may be necessary as a diagnostic test.    Your Medical Team Your medical team starts with  [place], your PCP or primary care provider.  Please use our services for your routine care such as physicals, screenings, immunizations, sick visits, and your first stop for general medical concerns.  You can call our number for after hours information for urgent questions that may need attention but cannot wait til the next business day.    Urgent care-urgent cares exist to provide care when your primary care office would typically be closed such as evenings or weekends.   Urgent care is for evaluation of urgent medical problems that do not necessarily require emergency department care, but cannot wait til the next business day when we are open.  Emergency department care-please reserve emergency department care for serious, urgent, possibly life-threatening medical problems.  This includes issues like possible stroke, heart attack, significant injury, mental health crisis, or other urgent need that requires immediate medical attention.     See your dentist office twice yearly for hygiene and cleaning visits.   Brush your teeth and floss your teeth daily.  See your eye doctor yearly for routine eye exam and screenings for glaucoma and retinal disease.   Specific Concerns: Hydration, separate trials of mucinex, zyrtec and /or Prilosec 1 week each to see if this helps phlegm and throat clearing   Vaccines:  Stay up to date with your tetanus shots and other required immunizations. You should have a booster for tetanus every 10 years. Be sure to get your flu shot every year, since 5%-20% of the [place] population comes down with the flu. The flu vaccine changes each  year, so being vaccinated once is not enough. Get your shot in the fall, before the flu season peaks.   Other vaccines to consider:  Pneumococcal vaccine to protect against certain types of pneumonia.  This is normally recommended for adults age 51 or older.  However, adults younger than 50 years old with certain underlying conditions such as  diabetes, heart or lung disease should also receive the vaccine.  Shingles vaccine to protect against Varicella Zoster if you are older than age 82, or younger than 50 years old with certain underlying illness.  If you have not had the Shingrix vaccine, please call your insurer to inquire about coverage for the Shingrix vaccine given in 2 doses.   Some insurers cover this vaccine after age 24, some cover this after age 64.  If your insurer covers this, then call to schedule appointment to have this vaccine here  Hepatitis A vaccine to protect against a form of infection of the liver by a virus acquired from food.  Hepatitis B vaccine to protect against a form of infection of the liver by a virus acquired from blood or body fluids, particularly if you work in health care.  If you plan to travel internationally, check with your local health department for specific vaccination recommendations.  Human Papilloma Virus or HPV causes cancer of the cervix, and other infections that can be transmitted from person to person. There is a vaccine for HPV, and males should get immunized between the ages of 42 and 73. It requires a series of 3 shots.   Covid/Coronavirus - as the vaccines are becoming available, please consider vaccination if you are a health care worker, first responder, or have significant health problems such as asthma, COPD, heart disease, hypertension, diabetes, obesity, multiple medical problems, over age 34yo, or immunocompromised.    You are up to date on Covid vaccine  I recommend the following vaccines: Tetanus every 10 years Yearly flu shot    What should I know about Cancer screening? Many types of cancers can be detected early and may often be prevented. Lung Cancer  You should be screened every year for lung cancer if: ? You are a current smoker who has smoked for at least 30 years. ? You are a former smoker who has quit within the past 15 years.  Talk to your health care  provider about your screening options, when you should start screening, and how often you should be screened.  Colorectal Cancer  Routine colorectal cancer screening usually begins at 50 years of age and should be repeated every 5-10 years until you are 50 years old. You may need to be screened more often if early forms of precancerous polyps or small growths are found. Your health care provider may recommend screening at an earlier age if you have risk factors for colon cancer.  Your health care provider may recommend using home test kits to check for hidden blood in the stool.  A small camera at the end of a tube can be used to examine your colon (sigmoidoscopy or colonoscopy). This checks for the earliest forms of colorectal cancer.  Prostate and Testicular Cancer  Depending on your age and overall health, your health care provider may do certain tests to screen for prostate and testicular cancer.  Talk to your health care provider about any symptoms or concerns you have about testicular or prostate cancer.  Skin Cancer  Check your skin from head to toe regularly.  Tell your health  care provider about any new moles or changes in moles, especially if: ? There is a change in a mole's size, shape, or color. ? You have a mole that is larger than a pencil eraser.  Always use sunscreen. Apply sunscreen liberally and repeat throughout the day.  Protect yourself by wearing long sleeves, pants, a wide-brimmed hat, and sunglasses when outside.   Are you up to date on cancer screenings:  COLON CANCER SCREENING:  Call insurance for coverage  PROSTATE CANCER SCREENING:  PSA today  TESTICULAR CANCER SCREENING:  check your self monthly at home     GENERAL RECOMMENDATIONS FOR GOOD HEALTH:  Healthy diet:  Eat a variety of foods, including fruit, vegetables, animal or vegetable protein, such as meat, fish, chicken, and eggs, or beans, lentils, tofu, and grains, such as rice.  Drink plenty  of water daily.  Decrease saturated fat in the diet, avoid lots of red meat, processed foods, sweets, fast foods, and fried foods.  Exercise:  Aerobic exercise helps maintain good heart health. At least 30-40 minutes of moderate-intensity exercise is recommended. For example, a brisk walk that increases your heart rate and breathing. This should be done on most days of the week.   Find a type of exercise or a variety of exercises that you enjoy so that it becomes a part of your daily life.  Examples are running, walking, swimming, water aerobics, and biking.  For motivation and support, explore group exercise such as aerobic class, spin class, Zumba, Yoga,or  martial arts, etc.    Set exercise goals for yourself, such as a certain weight goal, walk or run in a race such as a 5k walk/run.  Speak to your primary care provider about exercise goals.  Your weight readings per our records: Wt Readings from Last 3 Encounters:  02/04/20 166 lb (75.3 kg)  09/16/18 153 lb 6.4 oz (69.6 kg)  07/11/18 155 lb 12.8 oz (70.7 kg)    Body mass index is 23.82 kg/m.    Disease prevention:  If you smoke or chew tobacco, find out from your caregiver how to quit. It can literally save your life, no matter how long you have been a tobacco user. If you do not use tobacco, never begin.   Maintain a healthy diet and normal weight. Increased weight leads to problems with blood pressure and diabetes.   The Body Mass Index or BMI is a way of measuring how much of your body is fat. Having a BMI above 27 increases the risk of heart disease, diabetes, hypertension, stroke and other problems related to obesity. Your caregiver can help determine your BMI and based on it develop an exercise and dietary program to help you achieve or maintain this important measurement at a healthful level.  High blood pressure causes heart and blood vessel problems.  Persistent high blood pressure should be treated with medicine if  weight loss and exercise do not work.  Your blood pressure readings per our records:     BP Readings from Last 3 Encounters:  02/04/20 120/82  09/16/18 120/82  07/11/18 128/80     Fat and cholesterol leaves deposits in your arteries that can block them. This causes heart disease and vessel disease elsewhere in your body.  If your cholesterol is found to be high, or if you have heart disease or certain other medical conditions, then you may need to have your cholesterol monitored frequently and be treated with medication.   Ask if you should  have a cardiac stress test if your history suggests this. A stress test is a test done on a treadmill that looks for heart disease. This test can find disease prior to there being a problem.    Heart disease screening:  Consider CT calcium coronary score and or cardoilogy consult    Osteoporosis is a disease in which the bones lose minerals and strength as we age. This can result in serious bone fractures. Risk of osteoporosis can be identified using a bone density scan. Men ages 61 and over should discuss this with their caregivers. Ask your caregiver whether you should be taking a calcium supplement and Vitamin D, to reduce the rate of osteoporosis.   Avoid drinking alcohol in excess (more than two drinks per day).  Avoid use of street drugs. Do not share needles with anyone. Ask for professional help if you need assistance or instructions on stopping the use of alcohol, cigarettes, and/or drugs.  Brush your teeth twice a day with fluoride toothpaste, and floss once a day. Good oral hygiene prevents tooth decay and gum disease. The problems can be painful, unattractive, and can cause other health problems. Visit your dentist for a routine oral and dental check up and preventive care every 6-12 months.   Safety:  Use seatbelts 100% of the time, whether driving or as a passenger.  Use safety devices such as hearing protection if you work in environments  with loud noise or significant background noise.  Use safety glasses when doing any work that could send debris in to the eyes.  Use a helmet if you ride a bike or motorcycle.  Use appropriate safety gear for contact sports.  Talk to your caregiver about gun safety.  Use sunscreen with a SPF (or skin protection factor) of 15 or greater.  Lighter skinned people are at a greater risk of skin cancer. Don't forget to also wear sunglasses in order to protect your eyes from too much damaging sunlight. Damaging sunlight can accelerate cataract formation.   Keep carbon monoxide and smoke detectors in your home functioning at all times. Change the batteries every 6 months or use a model that plugs into the wall.    Sexual activity: . Sex is a normal part of life and sexual activity can continue into older adulthood for many healthy people.   . If you are having erectile dysfunction issues, please follow up to discuss this further.   . If you are not in a monogamous relationship or have more than one partner, please practice safe sex.  Use condoms. Condoms are used for birth control and to help reduce the spread of sexually transmitted infections (or STIs).  Some of the STIs are gonorrhea (the clap), chlamydia, syphilis, trichomonas, herpes, HPV (human papilloma virus) and HIV (human immunodeficiency virus) which causes AIDS. The herpes, HIV and HPV are viral illnesses that have no cure. These can result in disability, cancer and death.   We are able to test for STIs here at our office.

## 2020-02-05 LAB — CBC WITH DIFFERENTIAL/PLATELET
Basophils Absolute: 0.1 10*3/uL (ref 0.0–0.2)
Basos: 1 %
EOS (ABSOLUTE): 0.3 10*3/uL (ref 0.0–0.4)
Eos: 5 %
Hematocrit: 50.2 % (ref 37.5–51.0)
Hemoglobin: 17.3 g/dL (ref 13.0–17.7)
Immature Grans (Abs): 0 10*3/uL (ref 0.0–0.1)
Immature Granulocytes: 0 %
Lymphocytes Absolute: 1.7 10*3/uL (ref 0.7–3.1)
Lymphs: 27 %
MCH: 31.1 pg (ref 26.6–33.0)
MCHC: 34.5 g/dL (ref 31.5–35.7)
MCV: 90 fL (ref 79–97)
Monocytes Absolute: 0.7 10*3/uL (ref 0.1–0.9)
Monocytes: 11 %
Neutrophils Absolute: 3.6 10*3/uL (ref 1.4–7.0)
Neutrophils: 56 %
Platelets: 229 10*3/uL (ref 150–450)
RBC: 5.57 x10E6/uL (ref 4.14–5.80)
RDW: 12.4 % (ref 11.6–15.4)
WBC: 6.4 10*3/uL (ref 3.4–10.8)

## 2020-02-05 LAB — LIPID PANEL
Chol/HDL Ratio: 4.8 ratio (ref 0.0–5.0)
Cholesterol, Total: 186 mg/dL (ref 100–199)
HDL: 39 mg/dL — ABNORMAL LOW (ref >39)
LDL Chol Calc (NIH): 111 mg/dL — ABNORMAL HIGH (ref 0–99)
Triglycerides: 204 mg/dL — ABNORMAL HIGH (ref 0–149)
VLDL Cholesterol Cal: 36 mg/dL (ref 5–40)

## 2020-02-05 LAB — COMPREHENSIVE METABOLIC PANEL
ALT: 33 IU/L (ref 0–44)
AST: 18 IU/L (ref 0–40)
Albumin/Globulin Ratio: 1.8 (ref 1.2–2.2)
Albumin: 4.9 g/dL (ref 4.0–5.0)
Alkaline Phosphatase: 72 IU/L (ref 39–117)
BUN/Creatinine Ratio: 9 (ref 9–20)
BUN: 10 mg/dL (ref 6–24)
Bilirubin Total: 0.6 mg/dL (ref 0.0–1.2)
CO2: 26 mmol/L (ref 20–29)
Calcium: 9.7 mg/dL (ref 8.7–10.2)
Chloride: 101 mmol/L (ref 96–106)
Creatinine, Ser: 1.07 mg/dL (ref 0.76–1.27)
GFR calc Af Amer: 94 mL/min/{1.73_m2} (ref >59)
GFR calc non Af Amer: 81 mL/min/{1.73_m2} (ref >59)
Globulin, Total: 2.7 g/dL (ref 1.5–4.5)
Glucose: 98 mg/dL (ref 65–99)
Potassium: 4.7 mmol/L (ref 3.5–5.2)
Sodium: 141 mmol/L (ref 134–144)
Total Protein: 7.6 g/dL (ref 6.0–8.5)

## 2020-02-05 LAB — TSH: TSH: 2.28 u[IU]/mL (ref 0.450–4.500)

## 2020-02-05 LAB — PSA: Prostate Specific Ag, Serum: 1 ng/mL (ref 0.0–4.0)

## 2020-02-08 ENCOUNTER — Other Ambulatory Visit: Payer: Self-pay | Admitting: Medical

## 2020-02-08 MED ORDER — TAMSULOSIN HCL 0.4 MG PO CAPS: 0.4000 mg | ORAL_CAPSULE | Freq: Every day | ORAL | 1 refills | Status: DC

## 2020-02-09 ENCOUNTER — Telehealth: Payer: Self-pay | Admitting: Medical

## 2020-02-09 DIAGNOSIS — Z8249 Family history of ischemic heart disease and other diseases of the circulatory system: Secondary | ICD-10-CM

## 2020-02-09 NOTE — Telephone Encounter (Signed)
Please refer to Dr. Blanchie Dessert office for CT calcium score.  Make a note on the cover sheet this is for the $125 chest CT cardiac score

## 2020-02-10 NOTE — Telephone Encounter (Signed)
Referral was sent and order was placed

## 2020-02-24 ENCOUNTER — Inpatient Hospital Stay: Admission: RE | Admit: 2020-02-24 | Payer: BC Managed Care – PPO | Source: Ambulatory Visit

## 2020-02-26 ENCOUNTER — Telehealth: Payer: Self-pay | Admitting: Medical

## 2020-02-26 NOTE — Telephone Encounter (Signed)
Call and clarify what he wants to do regarding calcium CT

## 2020-03-01 NOTE — Telephone Encounter (Signed)
Ordering CT calcium score/cardiac score  Per my conversation with the CT scanning department at [facility]:  To order a calcium CT score we must do the following:  . They need an order such as a prescription pad order for the test, . it needs to say care of Dr. Rennis Golden, CC ordering provider Kristian Covey, PA-C . It needs a diagnosis which I provide in the chart record such as screen heart disease, hypertension, hyperlipidemia, family history of premature coronary artery disease, smoker, etc. . Fax order to [phone]   [facility]/Dr. Hilty's contact information is as follows:  Address: [address] [place], [place], [place] [address] Phone: [phone]    If anybody ask, I have cleared this with Dr. Rennis Golden before  These tests are $150 cash out-of-pocket My understanding is we can do the same test for the same price at [facility] and [facility] as well

## 2020-03-02 NOTE — Telephone Encounter (Signed)
The order has been faxed to Dr. Blanchie Dessert office

## 2020-03-09 ENCOUNTER — Telehealth: Payer: Self-pay | Admitting: Medical

## 2020-03-09 NOTE — Telephone Encounter (Signed)
Please resolve the issue of getting CT calcium score ordered

## 2020-03-11 NOTE — Telephone Encounter (Signed)
Please just let patient know that although we have been working hard to get this CT calcium score screening test ordered, there apparently had been changes with scheduling both at the heart care center and [facility] hospital.  There apparently is a big backlog of patients as well  We still plan to pursue this test, we are being told now by the hospital that it could take a few months to get appointment for the test given the backlog of people  I just want to keep him in the loop so he did not think we had forgot about him

## 2020-03-11 NOTE — Telephone Encounter (Signed)
LVM for pt advising of the issues we are having . KH

## 2020-03-21 ENCOUNTER — Other Ambulatory Visit: Payer: BC Managed Care – PPO

## 2020-04-11 ENCOUNTER — Ambulatory Visit: Payer: BC Managed Care – PPO | Admitting: Medical

## 2020-07-27 ENCOUNTER — Other Ambulatory Visit: Payer: Self-pay

## 2020-07-27 ENCOUNTER — Ambulatory Visit (INDEPENDENT_AMBULATORY_CARE_PROVIDER_SITE_OTHER): Payer: BC Managed Care – PPO

## 2020-07-27 DIAGNOSIS — Z23 Encounter for immunization: Secondary | ICD-10-CM | POA: Diagnosis not present

## 2020-09-24 HISTORY — PX: COLONOSCOPY: SHX174

## 2020-11-14 ENCOUNTER — Other Ambulatory Visit: Payer: Self-pay | Admitting: Medical

## 2020-11-14 DIAGNOSIS — R39198 Other difficulties with micturition: Secondary | ICD-10-CM

## 2020-11-14 DIAGNOSIS — Z809 Family history of malignant neoplasm, unspecified: Secondary | ICD-10-CM

## 2020-11-14 DIAGNOSIS — Z79899 Other long term (current) drug therapy: Secondary | ICD-10-CM

## 2020-11-14 DIAGNOSIS — Z1211 Encounter for screening for malignant neoplasm of colon: Secondary | ICD-10-CM

## 2020-11-14 DIAGNOSIS — R350 Frequency of micturition: Secondary | ICD-10-CM

## 2020-11-14 NOTE — Progress Notes (Signed)
c-met  

## 2020-11-15 ENCOUNTER — Telehealth: Payer: Self-pay | Admitting: Medical

## 2020-11-15 ENCOUNTER — Encounter: Payer: Self-pay | Admitting: Medical

## 2020-11-15 ENCOUNTER — Ambulatory Visit: Payer: BC Managed Care – PPO | Admitting: Medical

## 2020-11-15 ENCOUNTER — Ambulatory Visit
Admission: RE | Admit: 2020-11-15 | Discharge: 2020-11-15 | Disposition: A | Discharge status: Home / Self Care | Payer: BC Managed Care – PPO | Source: Ambulatory Visit | Attending: Medical | Admitting: Medical

## 2020-11-15 ENCOUNTER — Other Ambulatory Visit: Payer: Self-pay

## 2020-11-15 VITALS — BP 118/82 | HR 93 | Ht 70.0 in | Wt 164.2 lb

## 2020-11-15 DIAGNOSIS — G8929 Other chronic pain: Secondary | ICD-10-CM

## 2020-11-15 DIAGNOSIS — M25561 Pain in right knee: Secondary | ICD-10-CM | POA: Diagnosis not present

## 2020-11-15 DIAGNOSIS — Z129 Encounter for screening for malignant neoplasm, site unspecified: Secondary | ICD-10-CM | POA: Insufficient documentation

## 2020-11-15 DIAGNOSIS — R350 Frequency of micturition: Secondary | ICD-10-CM

## 2020-11-15 DIAGNOSIS — Z79899 Other long term (current) drug therapy: Secondary | ICD-10-CM | POA: Diagnosis not present

## 2020-11-15 DIAGNOSIS — Z809 Family history of malignant neoplasm, unspecified: Secondary | ICD-10-CM | POA: Diagnosis not present

## 2020-11-15 DIAGNOSIS — Z807 Family history of other malignant neoplasms of lymphoid, hematopoietic and related tissues: Secondary | ICD-10-CM

## 2020-11-15 DIAGNOSIS — Z1211 Encounter for screening for malignant neoplasm of colon: Secondary | ICD-10-CM | POA: Diagnosis not present

## 2020-11-15 DIAGNOSIS — I8393 Asymptomatic varicose veins of bilateral lower extremities: Secondary | ICD-10-CM

## 2020-11-15 DIAGNOSIS — R779 Abnormality of plasma protein, unspecified: Secondary | ICD-10-CM | POA: Insufficient documentation

## 2020-11-15 LAB — POCT URINALYSIS DIP (PROADVANTAGE DEVICE)
Bilirubin, UA: NEGATIVE
Blood, UA: NEGATIVE
Glucose, UA: NEGATIVE mg/dL
Ketones, POC UA: NEGATIVE mg/dL
Leukocytes, UA: NEGATIVE
Nitrite, UA: NEGATIVE
Protein Ur, POC: NEGATIVE mg/dL
Specific Gravity, Urine: 1.01
Urobilinogen, Ur: 0.2
pH, UA: 6 (ref 5.0–8.0)

## 2020-11-15 NOTE — Patient Instructions (Signed)
Please go to [facility] for your knee xray.   Their hours are 8am - 4:30 pm Monday - Friday.  Take your insurance card with you.  [facility] [phone]  [address], [address] [place], [place] [address]  [address] [place], [place] [address]  

## 2020-11-15 NOTE — Telephone Encounter (Signed)
Done

## 2020-11-15 NOTE — Telephone Encounter (Signed)
Referral for colonoscopy  varicose veins - referral to vein clinic for eval and treatment recommendations

## 2020-11-15 NOTE — Addendum Note (Signed)
Addended by: Victorio Palm on: 11/15/2020 12:47 PM   Modules accepted: Orders

## 2020-11-15 NOTE — Progress Notes (Signed)
Subjective:  Donald Mcdonald is a 50 y.o. male who presents for Chief Complaint  Patient presents with  . Knee Pain    Pain in right knee. Wants to discuss blood test and pain under right armpit     Been having right knee pains, on inside of knee, was hurting every step.  Has gotten a little better of late.   Was having some aching and throbbing in back of right calve.  No calve swelling.  Does have varicose veins thought.  Been having knee problems intermittent slowly progressing over time, few years.  No prior xray.  No recent injury or trauma.   No knee swelling.   No numbness or tingling in leg.    He notes some discomfort in right armpit x 1+ week.  No lump or knot but area feels swelling.    3rd covid booster was 07/2020.  No recent fever, night sweats, chills, no weight changes.   He emailed me recently about coming in for labs to screen since father just recently got diagnosed with multiple myeloma, although he has been smoldering MGUS for years.     No other aggravating or relieving factors.    No other c/o.  Past Medical History:  Diagnosis Date  . Depression    Dr. Yehuda Budd   Current Outpatient Medications on File Prior to Visit  Medication Sig Dispense Refill  . Acetylcarnitine HCl (ACETYL L-CARNITINE) 500 MG CAPS     . Acetylcysteine (NAC) 600 MG CAPS     . buPROPion (WELLBUTRIN SR) 150 MG 12 hr tablet Take 150 mg by mouth daily.    . COLOSTRUM PO     . fluvoxaMINE (LUVOX) 100 MG tablet Take 100 mg by mouth at bedtime.    Marland Kitchen L-Cysteine 500 MG TABS     . Misc Natural Products (TURMERIC CURCUMIN) CAPS     . Multiple Vitamin (MULTIVITAMIN) tablet Take 1 tablet by mouth daily.    . Omega-3 1000 MG CAPS      No current facility-administered medications on file prior to visit.   Family History  Problem Relation Age of Onset  . Heart disease Mother 82       CABG, stents  . Heart disease Father 38       pacemaker  . Diabetes Father   . Cancer Maternal Grandfather    . Cancer Paternal Grandmother      The following portions of the patient's history were reviewed and updated as appropriate: allergies, current medications, past family history, past medical history, past social history, past surgical history and problem list.  ROS Otherwise as in subjective above  Objective: BP 118/82   Pulse 93   Ht 5' 10"  (1.778 m)   Wt 164 lb 3.2 oz (74.5 kg)   SpO2 98%   BMI 23.56 kg/m   General appearance: alert, no distress, well developed, well nourished, white male, lean MSK: knee without obvious swelling, redness bruising or tenderness, no laxity, no pain with resisted ROM or special tests.  Bilat calves nontender, no palpable cord, no swelling or asymmetry but there are moderate varicose veins of lower legs bilat Axilla and chest and supraclavicular areas without obvious swelling, nodes or mass.  He is mildly tender with palpation in right axilla deep palpation behind pectoralis major muscle. Pulses: 2+ radial pulses, 2+ pedal pulses, normal cap refill Ext: no edema   Assessment: Encounter Diagnoses  Name Primary?  . Chronic pain of right knee Yes  . Screen  for colon cancer   . Family history of multiple myeloma   . Family history of cancer   . Screening for cancer   . Elevated serum protein level   . Varicose veins of both lower extremities, unspecified whether complicated      Plan: Chronic pain in right knee-referral for x-ray as a next step.  Possible OA vs DJD or other  Referral for colonoscopy  screening labs today given his concern for screen for cancer, father history of multiple myeloma  varicose veins - referral to vein clinic for eval and treatment recommendations   Avinash was seen today for knee pain.  Diagnoses and all orders for this visit:  Chronic pain of right knee -     DG Knee Complete 4 Views Right; Future  Screen for colon cancer  Family history of multiple myeloma  Family history of cancer  Screening  for cancer  Elevated serum protein level  Varicose veins of both lower extremities, unspecified whether complicated -     Ambulatory referral to Vascular Surgery    Follow up: pending labs, xray, referral

## 2020-11-17 LAB — PROTEIN ELECTROPHORESIS, SERUM
A/G Ratio: 1.5 (ref 0.7–1.7)
Albumin ELP: 4.4 g/dL (ref 2.9–4.4)
Alpha 1: 0.1 g/dL (ref 0.0–0.4)
Alpha 2: 0.6 g/dL (ref 0.4–1.0)
Beta: 1.1 g/dL (ref 0.7–1.3)
Gamma Globulin: 1.1 g/dL (ref 0.4–1.8)
Globulin, Total: 2.9 g/dL (ref 2.2–3.9)

## 2020-11-17 LAB — COMPREHENSIVE METABOLIC PANEL
ALT: 28 IU/L (ref 0–44)
AST: 15 IU/L (ref 0–40)
Albumin/Globulin Ratio: 1.9 (ref 1.2–2.2)
Albumin: 4.8 g/dL (ref 4.0–5.0)
Alkaline Phosphatase: 69 IU/L (ref 44–121)
BUN/Creatinine Ratio: 11 (ref 9–20)
BUN: 11 mg/dL (ref 6–24)
Bilirubin Total: 0.7 mg/dL (ref 0.0–1.2)
CO2: 24 mmol/L (ref 20–29)
Calcium: 9.4 mg/dL (ref 8.7–10.2)
Chloride: 102 mmol/L (ref 96–106)
Creatinine, Ser: 1.01 mg/dL (ref 0.76–1.27)
GFR calc Af Amer: 100 mL/min/{1.73_m2} (ref >59)
GFR calc non Af Amer: 86 mL/min/{1.73_m2} (ref >59)
Globulin, Total: 2.5 g/dL (ref 1.5–4.5)
Glucose: 72 mg/dL (ref 65–99)
Potassium: 4.9 mmol/L (ref 3.5–5.2)
Sodium: 142 mmol/L (ref 134–144)
Total Protein: 7.3 g/dL (ref 6.0–8.5)

## 2020-11-17 LAB — CBC WITH DIFFERENTIAL/PLATELET
Basophils Absolute: 0.1 10*3/uL (ref 0.0–0.2)
Basos: 1 %
EOS (ABSOLUTE): 0.3 10*3/uL (ref 0.0–0.4)
Eos: 5 %
Hematocrit: 48 % (ref 37.5–51.0)
Hemoglobin: 16.4 g/dL (ref 13.0–17.7)
Immature Grans (Abs): 0 10*3/uL (ref 0.0–0.1)
Immature Granulocytes: 0 %
Lymphocytes Absolute: 1.6 10*3/uL (ref 0.7–3.1)
Lymphs: 27 %
MCH: 30.8 pg (ref 26.6–33.0)
MCHC: 34.2 g/dL (ref 31.5–35.7)
MCV: 90 fL (ref 79–97)
Monocytes Absolute: 0.6 10*3/uL (ref 0.1–0.9)
Monocytes: 10 %
Neutrophils Absolute: 3.3 10*3/uL (ref 1.4–7.0)
Neutrophils: 57 %
Platelets: 233 10*3/uL (ref 150–450)
RBC: 5.33 x10E6/uL (ref 4.14–5.80)
RDW: 12.7 % (ref 11.6–15.4)
WBC: 5.8 10*3/uL (ref 3.4–10.8)

## 2021-01-31 ENCOUNTER — Ambulatory Visit (AMBULATORY_SURGERY_CENTER): Payer: BC Managed Care – PPO | Admitting: *Deleted

## 2021-01-31 ENCOUNTER — Other Ambulatory Visit: Payer: Self-pay

## 2021-01-31 VITALS — Ht 70.0 in | Wt 165.0 lb

## 2021-01-31 DIAGNOSIS — Z1211 Encounter for screening for malignant neoplasm of colon: Secondary | ICD-10-CM

## 2021-01-31 MED ORDER — SUTAB 1479-225-188 MG PO TABS: 1.0000 | ORAL_TABLET | Freq: Once | ORAL | 0 refills | Status: AC

## 2021-01-31 NOTE — Progress Notes (Addendum)
No egg or soy allergy known to patient  No issues with past sedation with any surgeries or procedures Patient denies ever being told they had issues or difficulty with intubation  No FH of Malignant Hyperthermia No diet pills per patient No home 02 use per patient  No blood thinners per patient  Pt denies issues with constipation  No A fib or A flutter  EMMI video to pt or via MyChart  COVID 19 guidelines implemented in [facility] today with Pt and RN  Pt is fully vaccinated  for Covid   VIrtual previsit complete. Instructions sent through mychart. Coupon mailed.   Due to the COVID-19 pandemic we are asking patients to follow certain guidelines.  Pt aware of COVID protocols and [facility] guidelines

## 2021-02-09 ENCOUNTER — Ambulatory Visit: Payer: BC Managed Care – PPO | Admitting: Medical

## 2021-02-09 ENCOUNTER — Encounter: Payer: Self-pay | Admitting: Medical

## 2021-02-09 ENCOUNTER — Encounter: Payer: Self-pay | Admitting: Gastroenterology

## 2021-02-09 ENCOUNTER — Other Ambulatory Visit: Payer: Self-pay

## 2021-02-09 VITALS — BP 110/84 | HR 89 | Ht 70.0 in | Wt 163.2 lb

## 2021-02-09 DIAGNOSIS — Z1152 Encounter for screening for COVID-19: Secondary | ICD-10-CM | POA: Insufficient documentation

## 2021-02-09 DIAGNOSIS — R39198 Other difficulties with micturition: Secondary | ICD-10-CM

## 2021-02-09 DIAGNOSIS — Z0000 Encounter for general adult medical examination without abnormal findings: Secondary | ICD-10-CM | POA: Insufficient documentation

## 2021-02-09 DIAGNOSIS — Z1211 Encounter for screening for malignant neoplasm of colon: Secondary | ICD-10-CM

## 2021-02-09 DIAGNOSIS — R8290 Unspecified abnormal findings in urine: Secondary | ICD-10-CM | POA: Insufficient documentation

## 2021-02-09 DIAGNOSIS — Z136 Encounter for screening for cardiovascular disorders: Secondary | ICD-10-CM

## 2021-02-09 DIAGNOSIS — M545 Low back pain, unspecified: Secondary | ICD-10-CM

## 2021-02-09 DIAGNOSIS — Z113 Encounter for screening for infections with a predominantly sexual mode of transmission: Secondary | ICD-10-CM | POA: Insufficient documentation

## 2021-02-09 DIAGNOSIS — Z7185 Encounter for immunization safety counseling: Secondary | ICD-10-CM

## 2021-02-09 DIAGNOSIS — Z125 Encounter for screening for malignant neoplasm of prostate: Secondary | ICD-10-CM | POA: Insufficient documentation

## 2021-02-09 DIAGNOSIS — Z1159 Encounter for screening for other viral diseases: Secondary | ICD-10-CM | POA: Insufficient documentation

## 2021-02-09 DIAGNOSIS — L989 Disorder of the skin and subcutaneous tissue, unspecified: Secondary | ICD-10-CM | POA: Insufficient documentation

## 2021-02-09 DIAGNOSIS — M25561 Pain in right knee: Secondary | ICD-10-CM

## 2021-02-09 DIAGNOSIS — G8929 Other chronic pain: Secondary | ICD-10-CM

## 2021-02-09 DIAGNOSIS — Z807 Family history of other malignant neoplasms of lymphoid, hematopoietic and related tissues: Secondary | ICD-10-CM

## 2021-02-09 DIAGNOSIS — Z809 Family history of malignant neoplasm, unspecified: Secondary | ICD-10-CM

## 2021-02-09 DIAGNOSIS — Z8249 Family history of ischemic heart disease and other diseases of the circulatory system: Secondary | ICD-10-CM

## 2021-02-09 DIAGNOSIS — R3911 Hesitancy of micturition: Secondary | ICD-10-CM | POA: Insufficient documentation

## 2021-02-09 HISTORY — DX: Encounter for screening for COVID-19: Z11.52

## 2021-02-09 LAB — POCT URINALYSIS DIP (PROADVANTAGE DEVICE)
Bilirubin, UA: NEGATIVE
Blood, UA: NEGATIVE
Glucose, UA: NEGATIVE mg/dL
Ketones, POC UA: NEGATIVE mg/dL
Leukocytes, UA: NEGATIVE
Nitrite, UA: NEGATIVE
Specific Gravity, Urine: 1.02
Urobilinogen, Ur: 0.2
pH, UA: 6 (ref 5.0–8.0)

## 2021-02-09 NOTE — Progress Notes (Signed)
Subjective:   HPI  Donald Mcdonald is a 50 y.o. male who presents for Chief Complaint  Patient presents with  . Annual Exam    Physical with fasting labs-discuss hep c screening     Patient Care Team: Leshaun Biebel, Camelia Eng, PA-C as PCP - General (Family Medicine) Sees dentist Sees eye doctor Dr. Harl Bowie, GI  Concerns: Has colonoscopy planned next Tuesday with Dr. Silverio Decamp.   Still having some right knee pains.  Last visit he had xray.  Here to discuss more.   No swelling, but gives him pain somewhat regulalry.    Urination sometimes takes a while to start, and gets some urine dribbling.   Lately has urine odor.   Still gets occasional dry cough..  Skin lesion on nose he wants looked at .  It comes and goes and go  Has chronic low back pain  Reviewed their medical, surgical, family, social, medication, and allergy history and updated chart as appropriate.  Past Medical History:  Diagnosis Date  . Constipation   . Depression    Dr. Yehuda Budd    Past Surgical History:  Procedure Laterality Date  . NO PAST SURGERIES  01/2020    Family History  Problem Relation Age of Onset  . Heart disease Mother 20       CABG, stents  . Heart disease Father 65       pacemaker  . Diabetes Father   . Cancer Maternal Grandfather   . Cancer Paternal Grandmother   . Colon cancer Paternal Grandmother   . Esophageal cancer Neg Hx   . Rectal cancer Neg Hx   . Stomach cancer Neg Hx      Current Outpatient Medications:  .  Acetylcarnitine HCl (ACETYL L-CARNITINE) 500 MG CAPS, , Disp: , Rfl:  .  buPROPion (WELLBUTRIN SR) 150 MG 12 hr tablet, Take 150 mg by mouth daily., Disp: , Rfl:  .  COLOSTRUM PO, , Disp: , Rfl:  .  fluvoxaMINE (LUVOX) 100 MG tablet, Take 100 mg by mouth at bedtime., Disp: , Rfl:  .  Glucosamine Sulfate 1000 MG TABS, Take 750 mg by mouth 2 (two) times daily., Disp: , Rfl:  .  Misc Natural Products (TURMERIC CURCUMIN) CAPS, , Disp: , Rfl:  .  Multiple  Vitamin (MULTIVITAMIN) tablet, Take 1 tablet by mouth daily., Disp: , Rfl:  .  Omega-3 1000 MG CAPS, , Disp: , Rfl:   No Known Allergies   Review of Systems Constitutional: -fever, -chills, -sweats, -unexpected weight change, -decreased appetite, -fatigue Allergy: -sneezing, -itching, -congestion Dermatology: -changing moles, --rash, -lumps ENT: -runny nose, -ear pain, -sore throat, -hoarseness, -sinus pain, -teeth pain, - ringing in ears, -hearing loss, -nosebleeds Cardiology: -chest pain, -palpitations, -swelling, -difficulty breathing when lying flat, -waking up short of breath Respiratory: -cough, -shortness of breath, -difficulty breathing with exercise or exertion, -wheezing, -coughing up blood Gastroenterology: -abdominal pain, -nausea, -vomiting, -diarrhea, -constipation, -blood in stool, -changes in bowel movement, -difficulty swallowing or eating Hematology: -bleeding, -bruising  Musculoskeletal: +joint aches, +muscle aches, -joint swelling, +back pain, -neck pain, -cramping, -changes in gait Ophthalmology: denies vision changes, eye redness, itching, discharge Urology: -burning with urination, +difficulty urinating, -blood in urine, -urinary frequency, +urgency, -incontinence Neurology: -headache, -weakness, -tingling, -numbness, -memory loss, -falls, -dizziness Psychology: -depressed mood, -agitation, -sleep problems Male GU: no testicular mass, pain, no lymph nodes swollen, no swelling, no rash.  Depression screen [facility] 2/9 02/09/2021 11/15/2020 02/04/2020 09/16/2018  Decreased Interest 0 0 0 0  Down, Depressed,  Hopeless 0 0 0 0  PHQ - 2 Score 0 0 0 0        Objective:  BP 110/84   Pulse 89   Ht 5' 10"  (1.778 m)   Wt 163 lb 3.2 oz (74 kg)   SpO2 96%   BMI 23.42 kg/m   General appearance: alert, no distress, WD/WN, Caucasian male Skin: small 11m round red crust on dorsal distal nose, scattered macules, no other worrisome lesions HEENT: normocephalic,  conjunctiva/corneas normal, sclerae anicteric, PERRLA, EOMi, nares patent, no discharge or erythema, pharynx normal Neck: supple, no lymphadenopathy, no thyromegaly, no masses, normal ROM, no bruits Chest: non tender, normal shape and expansion Heart: RRR, normal S1, S2, no murmurs Lungs: CTA bilaterally, no wheezes, rhonchi, or rales Abdomen: +bs, soft, non tender, non distended, no masses, no hepatomegaly, no splenomegaly, no bruits Back: non tender, normal ROM, no scoliosis Musculoskeletal: knees unremarkable, upper extremities non tender, no obvious deformity, normal ROM throughout, lower extremities non tender, no obvious deformity, normal ROM throughout Extremities: no edema, no cyanosis, no clubbing Pulses: 2+ symmetric, upper and lower extremities, normal cap refill Neurological: alert, oriented x 3, CN2-12 intact, strength normal upper extremities and lower extremities, sensation normal throughout, DTRs 2+ throughout, no cerebellar signs, gait normal Psychiatric: normal affect, behavior normal, pleasant  GU: normal male external genitalia,circumcised, nontender, no masses, no hernia, no lymphadenopathy Rectal: anus normal tone, prostate WLN, no nodules   Assessment and Plan :   Encounter Diagnoses  Name Primary?  . Encounter for health maintenance examination in adult Yes  . Vaccine counseling   . Screening for heart disease   . Screen for colon cancer   . Family history of premature CAD   . Family history of multiple myeloma   . Family history of cancer   . Decreased urine stream   . Chronic pain of right knee   . Chronic low back pain, unspecified back pain laterality, unspecified whether sciatica present   . Urinary hesitancy   . Abnormal urine odor   . Encounter for hepatitis C screening test for low risk patient   . Screen for STD (sexually transmitted disease)   . Skin lesion   . Screening for prostate cancer   . Encounter for screening for COVID-19     This  visit was a preventative care visit, also known as wellness visit or routine physical.   Topics typically include healthy lifestyle, diet, exercise, preventative care, vaccinations, sick and well care, proper use of emergency dept and after hours care, as well as other concerns.     Recommendations: Continue to return yearly for your annual wellness and preventative care visits.  This gives [place] chance to discuss healthy lifestyle, exercise, vaccinations, review your chart record, and perform screenings where appropriate.  I recommend you see your eye doctor yearly for routine vision care.  I recommend you see your dentist yearly for routine dental care including hygiene visits twice yearly.   Vaccination recommendations were reviewed Immunization History  Administered Date(s) Administered  . PFIZER(Purple Top)SARS-COV-2 Vaccination 12/04/2019, 12/28/2019, 07/27/2020  . Td 02/10/2015  . Tdap 02/10/2015    Shingles vaccine:  I recommend you have a shingles vaccine to help prevent shingles or herpes zoster outbreak.   Please call your insurer to inquire about coverage for the Shingrix vaccine given in 2 doses.   Some insurers cover this vaccine after age 51 some cover this after age 51  If your insurer covers this, then call  to schedule appointment to have this vaccine here.    Screening for cancer: Colon cancer screening: Follow up next week as scheduled with Dr. Silverio Decamp  We discussed PSA, prostate exam, and prostate cancer screening risks/benefits.     Skin cancer screening: Check your skin regularly for new changes, growing lesions, or other lesions of concern Come in for evaluation if you have skin lesions of concern.  Lung cancer screening: If you have a greater than 20 pack year history of tobacco use, then you may qualify for lung cancer screening with a chest CT scan.   Please call your insurance company to inquire about coverage for this test.  We currently don't have  screenings for other cancers besides breast, cervical, colon, and lung cancers.  If you have a strong family history of cancer or have other cancer screening concerns, please let me know.    Bone health: Get at least 150 minutes of aerobic exercise weekly Get weight bearing exercise at least once weekly Bone density test:   A bone density test is an imaging test that uses a type of X-ray to measure the amount of calcium and other minerals in your bones.  The test may be used to diagnose or screen you for a condition that causes weak or thin bones (osteoporosis), predict your risk for a broken bone (fracture), or determine how well your osteoporosis treatment is working. The bone density test is recommended for females 67 and older, or females or males <86 if certain risk factors such as thyroid disease, long term use of steroids such as for asthma or rheumatological issues, vitamin D deficiency, estrogen deficiency, family history of osteoporosis, self or family history of fragility fracture in first degree relative.    Heart health: Get at least 150 minutes of aerobic exercise weekly Limit alcohol It is important to maintain a healthy blood pressure and healthy cholesterol numbers  Heart disease screening: Screening for heart disease includes screening for blood pressure, fasting lipids, glucose/diabetes screening, BMI height to weight ratio, reviewed of smoking status, physical activity, and diet.    Goals include blood pressure 120/80 or less, maintaining a healthy lipid/cholesterol profile, preventing diabetes or keeping diabetes numbers under good control, not smoking or using tobacco products, exercising most days per week or at least 150 minutes per week of exercise, and eating healthy variety of fruits and vegetables, healthy oils, and avoiding unhealthy food choices like fried food, fast food, high sugar and high cholesterol foods.    Other tests may possibly include EKG test, CT  coronary calcium score, echocardiogram, exercise treadmill stress test.   Referral for CT calcium test given your family history    Medical care options: I recommend you continue to seek care here first for routine care.  We try really hard to have available appointments Monday through Friday daytime hours for sick visits, acute visits, and physicals.  Urgent care should be used for after hours and weekends for significant issues that cannot wait till the next day.  The emergency department should be used for significant potentially life-threatening emergencies.  The emergency department is expensive, can often have long wait times for less significant concerns, so try to utilize primary care, urgent care, or telemedicine when possible to avoid unnecessary trips to the emergency department.  Virtual visits and telemedicine have been introduced since the pandemic started in 2020, and can be convenient ways to receive medical care.  We offer virtual appointments as well to assist you in a  variety of options to seek medical care.    Separate significant issues discussed: Chronic pain right knee- consider physical therapy versus orthopedic referral.  Can use topical Aspercreme, Tylenol oral as needed over-the-counter.  Avoid deep knee bends or other heavy stress on the knees  Urinary hesitancy, urinary odor-discussed possible differential.  Prostate not enlarged on exam.  PSA and urine testing today.  Chronic low back pain-continue routine stretching and regular exercise.   Skin lesion of nose- I recommend hydrocortisone cream topically for the next 4 to 5 days.  If it comes back again as a pimple or bump take a picture with good resolution or come in so we can see this.  Currently is hard to say if this is a concern or not    Bentlee was seen today for annual exam.  Diagnoses and all orders for this visit:  Encounter for health maintenance examination in adult -     PSA -     Lipid panel -      Hepatitis C antibody -     HIV Antibody (routine testing w rflx) -     RPR -     GC/Chlamydia Probe Amp -     POCT Urinalysis DIP (Proadvantage Device) -     Urine Culture -     CBC -     Comprehensive metabolic panel  Vaccine counseling  Screening for heart disease  Screen for colon cancer  Family history of premature CAD  Family history of multiple myeloma  Family history of cancer  Decreased urine stream  Chronic pain of right knee  Chronic low back pain, unspecified back pain laterality, unspecified whether sciatica present  Urinary hesitancy  Abnormal urine odor  Encounter for hepatitis C screening test for low risk patient -     Hepatitis C antibody  Screen for STD (sexually transmitted disease) -     Hepatitis C antibody -     HIV Antibody (routine testing w rflx) -     RPR -     GC/Chlamydia Probe Amp  Skin lesion  Screening for prostate cancer -     PSA  Encounter for screening for COVID-19 -     SAR CoV2 Serology (COVID 19)AB(IGG)IA  Other orders -     CT CARDIAC SCORING (SELF PAY ONLY); Future    Follow-up pending labs, yearly for physical

## 2021-02-09 NOTE — Patient Instructions (Signed)
This visit was a preventative care visit, also known as wellness visit or routine physical.   Topics typically include healthy lifestyle, diet, exercise, preventative care, vaccinations, sick and well care, proper use of emergency dept and after hours care, as well as other concerns.     Recommendations: Continue to return yearly for your annual wellness and preventative care visits.  This gives [place] a chance to discuss healthy lifestyle, exercise, vaccinations, review your chart record, and perform screenings where appropriate.  I recommend you see your eye doctor yearly for routine vision care.  I recommend you see your dentist yearly for routine dental care including hygiene visits twice yearly.   Vaccination recommendations were reviewed Immunization History  Administered Date(s) Administered  . PFIZER(Purple Top)SARS-COV-2 Vaccination 12/04/2019, 12/28/2019, 07/27/2020  . Td 02/10/2015  . Tdap 02/10/2015    Shingles vaccine:  I recommend you have a shingles vaccine to help prevent shingles or herpes zoster outbreak.   Please call your insurer to inquire about coverage for the Shingrix vaccine given in 2 doses.   Some insurers cover this vaccine after age 63, some cover this after age 65.  If your insurer covers this, then call to schedule appointment to have this vaccine here.    Screening for cancer: Colon cancer screening: Follow up next week as scheduled with Dr. Lavon Paganini  We discussed PSA, prostate exam, and prostate cancer screening risks/benefits.     Skin cancer screening: Check your skin regularly for new changes, growing lesions, or other lesions of concern Come in for evaluation if you have skin lesions of concern.  Lung cancer screening: If you have a greater than 20 pack year history of tobacco use, then you may qualify for lung cancer screening with a chest CT scan.   Please call your insurance company to inquire about coverage for this test.  We currently don't  have screenings for other cancers besides breast, cervical, colon, and lung cancers.  If you have a strong family history of cancer or have other cancer screening concerns, please let me know.    Bone health: Get at least 150 minutes of aerobic exercise weekly Get weight bearing exercise at least once weekly Bone density test:   A bone density test is an imaging test that uses a type of X-ray to measure the amount of calcium and other minerals in your bones.  The test may be used to diagnose or screen you for a condition that causes weak or thin bones (osteoporosis), predict your risk for a broken bone (fracture), or determine how well your osteoporosis treatment is working. The bone density test is recommended for females 65 and older, or females or males <65 if certain risk factors such as thyroid disease, long term use of steroids such as for asthma or rheumatological issues, vitamin D deficiency, estrogen deficiency, family history of osteoporosis, self or family history of fragility fracture in first degree relative.    Heart health: Get at least 150 minutes of aerobic exercise weekly Limit alcohol It is important to maintain a healthy blood pressure and healthy cholesterol numbers  Heart disease screening: Screening for heart disease includes screening for blood pressure, fasting lipids, glucose/diabetes screening, BMI height to weight ratio, reviewed of smoking status, physical activity, and diet.    Goals include blood pressure 120/80 or less, maintaining a healthy lipid/cholesterol profile, preventing diabetes or keeping diabetes numbers under good control, not smoking or using tobacco products, exercising most days per week or at least 150 minutes  per week of exercise, and eating healthy variety of fruits and vegetables, healthy oils, and avoiding unhealthy food choices like fried food, fast food, high sugar and high cholesterol foods.    Other tests may possibly include EKG test,  CT coronary calcium score, echocardiogram, exercise treadmill stress test.   Referral for CT calcium test given your family history    Medical care options: I recommend you continue to seek care here first for routine care.  We try really hard to have available appointments Monday through Friday daytime hours for sick visits, acute visits, and physicals.  Urgent care should be used for after hours and weekends for significant issues that cannot wait till the next day.  The emergency department should be used for significant potentially life-threatening emergencies.  The emergency department is expensive, can often have long wait times for less significant concerns, so try to utilize primary care, urgent care, or telemedicine when possible to avoid unnecessary trips to the emergency department.  Virtual visits and telemedicine have been introduced since the pandemic started in 2020, and can be convenient ways to receive medical care.  We offer virtual appointments as well to assist you in a variety of options to seek medical care.    Separate significant issues discussed: Chronic pain right knee- consider physical therapy versus orthopedic referral.  Can use topical Aspercreme, Tylenol oral as needed over-the-counter.  Avoid deep knee bends or other heavy stress on the knees  Urinary hesitancy, urinary odor-discussed possible differential.  Prostate not enlarged on exam.  PSA and urine testing today.  Chronic low back pain-continue routine stretching and regular exercise.   Skin lesion of nose- I recommend hydrocortisone cream topically for the next 4 to 5 days.  If it comes back again as a pimple or bump take a picture with good resolution or come in so we can see this.  Currently is hard to say if this is a concern or not

## 2021-02-10 LAB — COMPREHENSIVE METABOLIC PANEL
ALT: 30 IU/L (ref 0–44)
AST: 18 IU/L (ref 0–40)
Albumin/Globulin Ratio: 2.3 — ABNORMAL HIGH (ref 1.2–2.2)
Albumin: 5.1 g/dL — ABNORMAL HIGH (ref 4.0–5.0)
Alkaline Phosphatase: 64 IU/L (ref 44–121)
BUN/Creatinine Ratio: 8 — ABNORMAL LOW (ref 9–20)
BUN: 8 mg/dL (ref 6–24)
Bilirubin Total: 0.6 mg/dL (ref 0.0–1.2)
CO2: 23 mmol/L (ref 20–29)
Calcium: 9.5 mg/dL (ref 8.7–10.2)
Chloride: 102 mmol/L (ref 96–106)
Creatinine, Ser: 1.03 mg/dL (ref 0.76–1.27)
Globulin, Total: 2.2 g/dL (ref 1.5–4.5)
Glucose: 100 mg/dL — ABNORMAL HIGH (ref 65–99)
Potassium: 4.3 mmol/L (ref 3.5–5.2)
Sodium: 142 mmol/L (ref 134–144)
Total Protein: 7.3 g/dL (ref 6.0–8.5)
eGFR: 88 mL/min/{1.73_m2} (ref >59)

## 2021-02-10 LAB — HIV ANTIBODY (ROUTINE TESTING W REFLEX): HIV Screen 4th Generation wRfx: NONREACTIVE

## 2021-02-10 LAB — CBC
Hematocrit: 46.6 % (ref 37.5–51.0)
Hemoglobin: 16 g/dL (ref 13.0–17.7)
MCH: 31.3 pg (ref 26.6–33.0)
MCHC: 34.3 g/dL (ref 31.5–35.7)
MCV: 91 fL (ref 79–97)
Platelets: 231 10*3/uL (ref 150–450)
RBC: 5.12 x10E6/uL (ref 4.14–5.80)
RDW: 12.6 % (ref 11.6–15.4)
WBC: 5 10*3/uL (ref 3.4–10.8)

## 2021-02-10 LAB — LIPID PANEL
Chol/HDL Ratio: 4.5 ratio (ref 0.0–5.0)
Cholesterol, Total: 180 mg/dL (ref 100–199)
HDL: 40 mg/dL (ref >39)
LDL Chol Calc (NIH): 112 mg/dL — ABNORMAL HIGH (ref 0–99)
Triglycerides: 156 mg/dL — ABNORMAL HIGH (ref 0–149)
VLDL Cholesterol Cal: 28 mg/dL (ref 5–40)

## 2021-02-10 LAB — SAR COV2 SEROLOGY (COVID19)AB(IGG),IA
SARS-CoV-2 Semi-Quant IgG Ab: 397 AU/mL (ref <13.0)
SARS-CoV-2 Spike Ab Interp: POSITIVE

## 2021-02-10 LAB — PSA: Prostate Specific Ag, Serum: 1.2 ng/mL (ref 0.0–4.0)

## 2021-02-10 LAB — RPR: RPR Ser Ql: NONREACTIVE

## 2021-02-10 LAB — HEPATITIS C ANTIBODY: Hep C Virus Ab: <0.1 s/co ratio (ref 0.0–0.9)

## 2021-02-11 LAB — URINE CULTURE: Organism ID, Bacteria: NO GROWTH

## 2021-02-11 LAB — GC/CHLAMYDIA PROBE AMP
Chlamydia trachomatis, NAA: NEGATIVE
Neisseria Gonorrhoeae by PCR: NEGATIVE

## 2021-02-13 ENCOUNTER — Encounter: Payer: BC Managed Care – PPO | Admitting: Gastroenterology

## 2021-02-14 ENCOUNTER — Other Ambulatory Visit: Payer: Self-pay

## 2021-02-14 ENCOUNTER — Ambulatory Visit (AMBULATORY_SURGERY_CENTER): Payer: BC Managed Care – PPO | Admitting: Gastroenterology

## 2021-02-14 ENCOUNTER — Encounter: Payer: Self-pay | Admitting: Gastroenterology

## 2021-02-14 VITALS — BP 115/80 | HR 68 | Temp 97.5°F | Resp 13 | Ht 70.0 in | Wt 165.0 lb

## 2021-02-14 DIAGNOSIS — Z1211 Encounter for screening for malignant neoplasm of colon: Secondary | ICD-10-CM

## 2021-02-14 MED ORDER — SODIUM CHLORIDE 0.9 % IV SOLN: 500.0000 mL | Freq: Once | INTRAVENOUS | Status: DC

## 2021-02-14 NOTE — Progress Notes (Signed)
[place] vital signs. 

## 2021-02-14 NOTE — Progress Notes (Signed)
Report to PACU, RN, vss, BBS= Clear.  

## 2021-02-14 NOTE — Op Note (Signed)
[facility] Patient Name: Donald Mcdonald Procedure Date: 02/14/2021 10:49 AM MRN: [id number] Endoscopist: Napoleon Form , MD Age: 50 Referring MD:  Date of Birth: 04/01/1971 Gender: Male Account #: [id number] Procedure:                Colonoscopy Indications:              Screening for colorectal malignant neoplasm Medicines:                Monitored Anesthesia Care Procedure:                Pre-Anesthesia Assessment:                           - Prior to the procedure, a History and Physical                            was performed, and patient medications and                            allergies were reviewed. The patient's tolerance of                            previous anesthesia was also reviewed. The risks                            and benefits of the procedure and the sedation                            options and risks were discussed with the patient.                            All questions were answered, and informed consent                            was obtained. Prior Anticoagulants: The patient has                            taken no previous anticoagulant or antiplatelet                            agents. ASA Grade Assessment: II - A patient with                            mild systemic disease. After reviewing the risks                            and benefits, the patient was deemed in                            satisfactory condition to undergo the procedure.                           After obtaining informed consent, the colonoscope  was passed under direct vision. Throughout the                            procedure, the patient's blood pressure, pulse, and                            oxygen saturations were monitored continuously. The                            Olympus PFC-[id number] (#[id number]) Colonoscope was                            introduced through the anus and advanced to the the                            cecum,  identified by appendiceal orifice and                            ileocecal valve. The colonoscopy was performed                            without difficulty. The patient tolerated the                            procedure well. The quality of the bowel                            preparation was adequate to identify polyps 6 mm                            and larger in size. The ileocecal valve,                            appendiceal orifice, and rectum were photographed. Scope In: 10:00:59 AM Scope Out: 11:13:12 AM Scope Withdrawal Time: 0 hours 9 minutes 38 seconds  Total Procedure Duration: 1 hour 12 minutes 13 seconds  Findings:                 The perianal and digital rectal examinations were                            normal.                           Non-bleeding internal hemorrhoids were found during                            retroflexion. The hemorrhoids were medium-sized.                           The exam was otherwise without abnormality. Complications:            No immediate complications. Estimated Blood Loss:     Estimated blood loss was minimal. Impression:               - Non-bleeding internal hemorrhoids.                           -  The examination was otherwise normal.                           - No specimens collected. Recommendation:           - Patient has a contact number available for                            emergencies. The signs and symptoms of potential                            delayed complications were discussed with the                            patient. Return to normal activities tomorrow.                            Written discharge instructions were provided to the                            patient.                           - Resume previous diet.                           - Continue present medications.                           - Repeat colonoscopy in 5 years because the bowel                            preparation was suboptimal.                            - For future colonoscopy the patient will require                            an extended preparation. If there are any                            questions, please contact the gastroenterologist. Napoleon Form, MD 02/14/2021 11:16:34 AM This report has been signed electronically.

## 2021-02-14 NOTE — Progress Notes (Signed)
Pt's states no medical or surgical changes since previsit or office visit. 

## 2021-02-14 NOTE — Patient Instructions (Signed)
YOU HAD AN ENDOSCOPIC PROCEDURE TODAY AT THE [facility]:   Refer to the procedure report that was given to you for any specific questions about what was found during the examination.  If the procedure report does not answer your questions, please call your gastroenterologist to clarify.  If you requested that your care partner not be given the details of your procedure findings, then the procedure report has been included in a sealed envelope for you to review at your convenience later.  YOU SHOULD EXPECT: Some feelings of bloating in the abdomen. Passage of more gas than usual.  Walking can help get rid of the air that was put into your GI tract during the procedure and reduce the bloating. If you had a lower endoscopy (such as a colonoscopy or flexible sigmoidoscopy) you may notice spotting of blood in your stool or on the toilet paper. If you underwent a bowel prep for your procedure, you may not have a normal bowel movement for a few days.  Please Note:  You might notice some irritation and congestion in your nose or some drainage.  This is from the oxygen used during your procedure.  There is no need for concern and it should clear up in a day or so.  SYMPTOMS TO REPORT IMMEDIATELY:  Following lower endoscopy (colonoscopy or flexible sigmoidoscopy):  Excessive amounts of blood in the stool  Significant tenderness or worsening of abdominal pains  Swelling of the abdomen that is new, acute  Fever of 100F or higher   For urgent or emergent issues, a gastroenterologist can be reached at any hour by calling [phone]. Do not use MyChart messaging for urgent concerns.    DIET:  We do recommend a small meal at first, but then you may proceed to your regular diet.  Drink plenty of fluids but you should avoid alcoholic beverages for 24 hours.  ACTIVITY:  You should plan to take it easy for the rest of today and you should NOT DRIVE or use heavy machinery until tomorrow (because  of the sedation medicines used during the test).    FOLLOW UP: Our staff will call the number listed on your records 48-72 hours following your procedure to check on you and address any questions or concerns that you may have regarding the information given to you following your procedure. If we do not reach you, we will leave a message.  We will attempt to reach you two times.  During this call, we will ask if you have developed any symptoms of COVID 19. If you develop any symptoms (ie: fever, flu-like symptoms, shortness of breath, cough etc.) before then, please call [phone].  If you test positive for Covid 19 in the 2 weeks post procedure, please call and report this information to us.    If any biopsies were taken you will be contacted by phone or by letter within the next 1-3 weeks.  Please call us at [phone] if you have not heard about the biopsies in 3 weeks.    SIGNATURES/CONFIDENTIALITY: You and/or your care partner have signed paperwork which will be entered into your electronic medical record.  These signatures attest to the fact that that the information above on your After Visit Summary has been reviewed and is understood.  Full responsibility of the confidentiality of this discharge information lies with you and/or your care-partner.    Resume medications. Information given on hemorrhoids. 

## 2021-02-16 ENCOUNTER — Telehealth: Payer: Self-pay | Admitting: *Deleted

## 2021-02-16 ENCOUNTER — Telehealth: Payer: Self-pay

## 2021-02-16 NOTE — Telephone Encounter (Signed)
No answer for post procedure call back. Left message for patient to call with questions or concerns. 

## 2021-02-16 NOTE — Telephone Encounter (Signed)
  Follow up Call-  Call back number 02/14/2021  Post procedure Call Back phone  # [phone]  Permission to leave phone message Yes  Some recent data might be hidden     Patient questions:  Do you have a fever, pain , or abdominal swelling? No. Pain Score  0 *  Have you tolerated food without any problems? Yes.    Have you been able to return to your normal activities? Yes.    Do you have any questions about your discharge instructions: Diet   No. Medications  No. Follow up visit  No.  Do you have questions or concerns about your Care? No.  Actions: * If pain score is 4 or above: No action needed, pain <4.  1. Have you developed a fever since your procedure? no  2.   Have you had an respiratory symptoms (SOB or cough) since your procedure? no  3.   Have you tested positive for COVID 19 since your procedure no  4.   Have you had any family members/close contacts diagnosed with the COVID 19 since your procedure?  no   If yes to any of these questions please route to Laverna Peace, RN and Karlton Lemon, RN

## 2021-02-21 ENCOUNTER — Other Ambulatory Visit: Payer: Self-pay

## 2021-02-21 ENCOUNTER — Ambulatory Visit (HOSPITAL_BASED_OUTPATIENT_CLINIC_OR_DEPARTMENT_OTHER)
Admission: RE | Admit: 2021-02-21 | Discharge: 2021-02-21 | Disposition: A | Discharge status: Home / Self Care | Payer: BC Managed Care – PPO | Source: Ambulatory Visit | Attending: Medical | Admitting: Medical

## 2021-02-21 DIAGNOSIS — Z8249 Family history of ischemic heart disease and other diseases of the circulatory system: Secondary | ICD-10-CM | POA: Insufficient documentation

## 2021-02-21 DIAGNOSIS — Z049 Encounter for examination and observation for unspecified reason: Secondary | ICD-10-CM | POA: Insufficient documentation

## 2021-05-03 DIAGNOSIS — G8929 Other chronic pain: Secondary | ICD-10-CM

## 2022-02-12 ENCOUNTER — Ambulatory Visit (INDEPENDENT_AMBULATORY_CARE_PROVIDER_SITE_OTHER): Payer: BC Managed Care – PPO | Admitting: Medical

## 2022-02-12 ENCOUNTER — Encounter: Payer: Self-pay | Admitting: Medical

## 2022-02-12 VITALS — BP 118/80 | HR 80 | Temp 98.3°F | Ht 70.75 in | Wt 159.0 lb

## 2022-02-12 DIAGNOSIS — M545 Low back pain, unspecified: Secondary | ICD-10-CM | POA: Diagnosis not present

## 2022-02-12 DIAGNOSIS — Z125 Encounter for screening for malignant neoplasm of prostate: Secondary | ICD-10-CM

## 2022-02-12 DIAGNOSIS — Z8249 Family history of ischemic heart disease and other diseases of the circulatory system: Secondary | ICD-10-CM

## 2022-02-12 DIAGNOSIS — M25561 Pain in right knee: Secondary | ICD-10-CM

## 2022-02-12 DIAGNOSIS — G8929 Other chronic pain: Secondary | ICD-10-CM | POA: Insufficient documentation

## 2022-02-12 DIAGNOSIS — Z807 Family history of other malignant neoplasms of lymphoid, hematopoietic and related tissues: Secondary | ICD-10-CM

## 2022-02-12 DIAGNOSIS — R053 Chronic cough: Secondary | ICD-10-CM

## 2022-02-12 DIAGNOSIS — Z0000 Encounter for general adult medical examination without abnormal findings: Secondary | ICD-10-CM | POA: Diagnosis not present

## 2022-02-12 DIAGNOSIS — Z7185 Encounter for immunization safety counseling: Secondary | ICD-10-CM

## 2022-02-12 DIAGNOSIS — M25511 Pain in right shoulder: Secondary | ICD-10-CM

## 2022-02-12 DIAGNOSIS — Z13220 Encounter for screening for lipoid disorders: Secondary | ICD-10-CM

## 2022-02-12 DIAGNOSIS — Z87891 Personal history of nicotine dependence: Secondary | ICD-10-CM

## 2022-02-12 DIAGNOSIS — M25512 Pain in left shoulder: Secondary | ICD-10-CM

## 2022-02-12 LAB — URINALYSIS
Bilirubin, UA: NEGATIVE
Glucose, UA: NEGATIVE
Ketones, UA: NEGATIVE
Leukocytes,UA: NEGATIVE
Nitrite, UA: NEGATIVE
Protein,UA: NEGATIVE
RBC, UA: NEGATIVE
Specific Gravity, UA: 1.023 (ref 1.005–1.030)
Urobilinogen, Ur: 0.2 mg/dL (ref 0.2–1.0)
pH, UA: 5.5 (ref 5.0–7.5)

## 2022-02-12 NOTE — Assessment & Plan Note (Signed)
Fortunately Jay CT coronary score last year was 0.  Continue efforts with healthy diet and exercise

## 2022-02-12 NOTE — Progress Notes (Signed)
Subjective:   HPI  Donald Mcdonald is a 51 y.o. male who presents for Chief Complaint  Patient presents with   Annual Exam    CPE fasting labs, eye Dr. Maida Sale dentist yearly Dr. Deanna Artis.     Patient Care Team: Nasiya Pascual, Camelia Eng, PA-C as PCP - General (Family Medicine) Mauri Pole, MD as Consulting Physician (Gastroenterology) Sees dentist Sees eye doctor  Concerns: Here for concerns ongoing, chronic  He notes chronic back pains intermittent.  He does do some walking.  He walks to work every day for his commute.  He does some weights and some stretching but not consistent routine  Similarly he notes ongoing bilateral shoulder pains on and off for the last 2 years.  No injury or fall.  He has chronic knee pain.  He is seen Ortho about this in the past.  This still bothers him occasionally.  No swelling.  He was using some supplements but found out through [place] recently that one of his supplements suppliers was using counterfeit material.  [employer] sent him and noticed that it was found to be rice flour and not what was on the label.  He quit taking this but had been taking it regularly.  Diet is Pescetarian  He also notes ongoing dry cough that is intermittent.  This has been going on since before 2019.  We discussed in 2019 but he says it still happens.  He denies significant allergy or GERD issues.  He is a former smoker.  He smoked for a total of about 10 years but has not smoked in several years.  He would like a letter regarding massage therapy that he can use for [facility] monies  Reviewed their medical, surgical, family, social, medication, and allergy history and updated chart as appropriate.  Past Medical History:  Diagnosis Date   Constipation    Depression    Dr. Yehuda Budd    Past Surgical History:  Procedure Laterality Date   NO PAST SURGERIES  01/2020    Family History  Problem Relation Age of Onset   Heart disease Mother 44       CABG,  stents   Cancer Father        multiple myeloma   Heart disease Father 22       pacemaker   Diabetes Father    Cancer Maternal Grandfather    Cancer Paternal Grandmother    Colon cancer Paternal Grandmother    Esophageal cancer Neg Hx    Rectal cancer Neg Hx    Stomach cancer Neg Hx      Current Outpatient Medications:    Acetylcarnitine HCl (ACETYL L-CARNITINE) 500 MG CAPS, , Disp: , Rfl:    buPROPion (WELLBUTRIN SR) 150 MG 12 hr tablet, Take 150 mg by mouth daily., Disp: , Rfl:    fluvoxaMINE (LUVOX) 100 MG tablet, Take 100 mg by mouth at bedtime., Disp: , Rfl:    Glucosamine Sulfate 1000 MG TABS, Take 750 mg by mouth 2 (two) times daily., Disp: , Rfl:    Misc Natural Products (TURMERIC CURCUMIN) CAPS, , Disp: , Rfl:    Multiple Vitamin (MULTIVITAMIN) tablet, Take 1 tablet by mouth daily., Disp: , Rfl:    Omega-3 1000 MG CAPS, , Disp: , Rfl:    COLOSTRUM PO, , Disp: , Rfl:   No Known Allergies  Review of Systems  Constitutional:  Negative for chills, fever, malaise/fatigue and weight loss.  HENT:  Negative for congestion, ear pain, hearing loss,  sore throat and tinnitus.   Eyes:  Negative for blurred vision, pain and redness.  Respiratory:  Positive for cough. Negative for hemoptysis and shortness of breath.   Cardiovascular:  Negative for chest pain, palpitations, orthopnea, claudication and leg swelling.  Gastrointestinal:  Negative for abdominal pain, blood in stool, constipation, diarrhea, nausea and vomiting.  Genitourinary:  Negative for dysuria, flank pain, frequency, hematuria and urgency.  Musculoskeletal:  Positive for back pain, joint pain and myalgias. Negative for falls.  Skin:  Negative for itching and rash.  Neurological:  Negative for dizziness, tingling, speech change, weakness and headaches.  Endo/Heme/Allergies:  Negative for polydipsia. Does not bruise/bleed easily.  Psychiatric/Behavioral:  Negative for depression and memory loss. The patient is not  nervous/anxious and does not have insomnia.        02/12/2022    8:26 AM 02/09/2021    9:33 AM 11/15/2020   10:58 AM 02/04/2020    8:44 AM 09/16/2018    9:45 AM  Depression screen PHQ 2/9  Decreased Interest 1 0 0 0 0  Down, Depressed, Hopeless 1 0 0 0 0  PHQ - 2 Score 2 0 0 0 0  Altered sleeping 0      Tired, decreased energy 1      Change in appetite 0      Feeling bad or failure about yourself  0      Trouble concentrating 1      Moving slowly or fidgety/restless 0      Suicidal thoughts 0      PHQ-9 Score 4      Difficult doing work/chores Not difficult at all            Objective:  BP 118/80   Pulse 80   Temp 98.3 F (36.8 C)   Ht 5' 10.75" (1.797 m)   Wt 159 lb (72.1 kg)   BMI 22.33 kg/m   General appearance: alert, no distress, WD/WN, Caucasian male Skin: Unremarkable HEENT: normocephalic, conjunctiva/corneas normal, sclerae anicteric, PERRLA, EOMi, nares patent, no discharge or erythema, pharynx normal Oral cavity: MMM, tongue normal, teeth normal Neck: supple, no lymphadenopathy, no thyromegaly, no masses, normal ROM, no bruits Chest: non tender, normal shape and expansion Heart: RRR, normal S1, S2, no murmurs Lungs: CTA bilaterally, no wheezes, rhonchi, or rales Abdomen: +bs, soft, non tender, non distended, no masses, no hepatomegaly, no splenomegaly, no bruits Back: not particular tender, seemingly normal ROM, no scoliosis Musculoskeletal: Shoulder is nontender with no deformity or swelling, seemingly normal range of motion, otherwise upper extremities non tender, no obvious deformity, normal ROM throughout, lower extremities non tender, no obvious deformity, normal ROM throughout Extremities: no edema, no cyanosis, no clubbing Pulses: 2+ symmetric, upper and lower extremities, normal cap refill Neurological: alert, oriented x 3, CN2-12 intact, strength normal upper extremities and lower extremities, sensation normal throughout, DTRs 2+ throughout, no  cerebellar signs, gait normal Psychiatric: normal affect, behavior normal, pleasant  GU: normal male external genitalia,circumcised, nontender, no masses, no hernia, no lymphadenopathy Rectal: Anus normal tone, prostate within normal limits   Assessment and Plan :   Encounter Diagnoses  Name Primary?   Encounter for health maintenance examination in adult    Vaccine counseling    Screening for prostate cancer    Family history of premature CAD    Family history of multiple myeloma    Chronic pain of right knee    Chronic low back pain, unspecified back pain laterality, unspecified whether sciatica present  Chronic pain of both shoulders    Screening for lipid disorders    Chronic cough Yes   Former smoker     This visit was a preventative care visit, also known as wellness visit or routine physical.   Topics typically include healthy lifestyle, diet, exercise, preventative care, vaccinations, sick and well care, proper use of emergency dept and after hours care, as well as other concerns.     Recommendations: Continue to return yearly for your annual wellness and preventative care visits.  This gives [place] a chance to discuss healthy lifestyle, exercise, vaccinations, review your chart record, and perform screenings where appropriate.  I recommend you see your eye doctor yearly for routine vision care.  I recommend you see your dentist yearly for routine dental care including hygiene visits twice yearly.   Vaccination recommendations were reviewed Immunization History  Administered Date(s) Administered   Influenza Split 08/03/2021   PFIZER(Purple Top)SARS-COV-2 Vaccination 12/04/2019, 12/28/2019, 07/27/2020   Td 02/10/2015   Tdap 02/10/2015    Shingles vaccine:  I recommend you have a shingles vaccine to help prevent shingles or herpes zoster outbreak.   Please call your insurer to inquire about coverage for the Shingrix vaccine given in 2 doses.   Some insurers cover this  vaccine after age 61, some cover this after age 53.  If your insurer covers this, then call to schedule appointment to have this vaccine here.   Screening for cancer: Colon cancer screening: I reviewed your colonoscopy on file that is up to date from 2022 due repeat in 5 years due to less than desirable prep  We discussed PSA, prostate exam, and prostate cancer screening risks/benefits.     Skin cancer screening: Check your skin regularly for new changes, growing lesions, or other lesions of concern Come in for evaluation if you have skin lesions of concern.  Lung cancer screening: If you have a greater than 20 pack year history of tobacco use, then you may qualify for lung cancer screening with a chest CT scan.   Please call your insurance company to inquire about coverage for this test.  We currently don't have screenings for other cancers besides breast, cervical, colon, and lung cancers.  If you have a strong family history of cancer or have other cancer screening concerns, please let me know.    Bone health: Get at least 150 minutes of aerobic exercise weekly Get weight bearing exercise at least once weekly Bone density test:  A bone density test is an imaging test that uses a type of X-ray to measure the amount of calcium and other minerals in your bones. The test may be used to diagnose or screen you for a condition that causes weak or thin bones (osteoporosis), predict your risk for a broken bone (fracture), or determine how well your osteoporosis treatment is working. The bone density test is recommended for females 39 and older, or females or males <36 if certain risk factors such as thyroid disease, long term use of steroids such as for asthma or rheumatological issues, vitamin D deficiency, estrogen deficiency, family history of osteoporosis, self or family history of fragility fracture in first degree relative.    Heart health: Get at least 150 minutes of aerobic exercise  weekly Limit alcohol It is important to maintain a healthy blood pressure and healthy cholesterol numbers  Heart disease screening: Screening for heart disease includes screening for blood pressure, fasting lipids, glucose/diabetes screening, BMI height to weight ratio, reviewed of smoking status,  physical activity, and diet.    Goals include blood pressure 120/80 or less, maintaining a healthy lipid/cholesterol profile, preventing diabetes or keeping diabetes numbers under good control, not smoking or using tobacco products, exercising most days per week or at least 150 minutes per week of exercise, and eating healthy variety of fruits and vegetables, healthy oils, and avoiding unhealthy food choices like fried food, fast food, high sugar and high cholesterol foods.    I reviewed the 2022 CT coronary calcium score that was 0 thankfully    Medical care options: I recommend you continue to seek care here first for routine care.  We try really hard to have available appointments Monday through Friday daytime hours for sick visits, acute visits, and physicals.  Urgent care should be used for after hours and weekends for significant issues that cannot wait till the next day.  The emergency department should be used for significant potentially life-threatening emergencies.  The emergency department is expensive, can often have long wait times for less significant concerns, so try to utilize primary care, urgent care, or telemedicine when possible to avoid unnecessary trips to the emergency department.  Virtual visits and telemedicine have been introduced since the pandemic started in 2020, and can be convenient ways to receive medical care.  We offer virtual appointments as well to assist you in a variety of options to seek medical care.    Separate significant issues discussed:  Problem List Items Addressed This Visit     Family history of premature CAD    Fortunately [place] CT coronary score  last year was 0.  Continue efforts with healthy diet and exercise       Chronic low back pain    I recommend a more consistent program of back strengthening exercises, stretching and continue aerobic exercise such as walking or jogging or bicycling or swimming.  I reviewed 2017 x-ray lumbar spine that was normal.  Consider referral to physical therapy.  He will let me know if he wants to pursue this.  I will work on a letter for him to use through [facility] account to pay for massage therapy as it would benefit his back pains and other aches.       Vaccine counseling    Shingles vaccine:  I recommend you have a shingles vaccine to help prevent shingles or herpes zoster outbreak.   Please call your insurer to inquire about coverage for the Shingrix vaccine given in 2 doses.   Some insurers cover this vaccine after age 31, some cover this after age 39.  If your insurer covers this, then call to schedule appointment to have this vaccine here.        Chronic pain of right knee    Consider follow-up with orthopedics       Family history of multiple myeloma   Encounter for health maintenance examination in adult   Relevant Orders   Comprehensive metabolic panel   CBC with Differential/Platelet   Lipid panel   PSA   Urinalysis   Screening for prostate cancer   Relevant Orders   PSA   Chronic pain of both shoulders    Range of motion normal, no obvious deformity on exam.  Consider physical therapy referral, regular strengthening exercises.  He will consider and let me know       Screening for lipid disorders   Relevant Orders   Lipid panel   Chronic cough - Primary    CT coronary calcium score last year  showed no abnormal findings of the lungs.  We discussed differential for chronic cough.  Repeat spirometry today.  Advise he try separate trial of antihistamine such as Zyrtec over-the-counter for couple weeks and then separate trial of acid reflux medicine for a few weeks such as famotidine  over-the-counter.  If the cough persist despite these things then consider referral to pulmonology.       Relevant Orders   Spirometry with graph (Completed)   Former smoker    Follow-up pending labs, yearly for physical

## 2022-02-12 NOTE — Patient Instructions (Signed)
This visit was a preventative care visit, also known as wellness visit or routine physical.   Topics typically include healthy lifestyle, diet, exercise, preventative care, vaccinations, sick and well care, proper use of emergency dept and after hours care, as well as other concerns.     Recommendations: Continue to return yearly for your annual wellness and preventative care visits.  This gives [place] a chance to discuss healthy lifestyle, exercise, vaccinations, review your chart record, and perform screenings where appropriate.  I recommend you see your eye doctor yearly for routine vision care.  I recommend you see your dentist yearly for routine dental care including hygiene visits twice yearly.   Vaccination recommendations were reviewed Immunization History  Administered Date(s) Administered   Influenza Split 08/03/2021   PFIZER(Purple Top)SARS-COV-2 Vaccination 12/04/2019, 12/28/2019, 07/27/2020   Td 02/10/2015   Tdap 02/10/2015    Shingles vaccine:  I recommend you have a shingles vaccine to help prevent shingles or herpes zoster outbreak.   Please call your insurer to inquire about coverage for the Shingrix vaccine given in 2 doses.   Some insurers cover this vaccine after age 36, some cover this after age 51.  If your insurer covers this, then call to schedule appointment to have this vaccine here.   Screening for cancer: Colon cancer screening: I reviewed your colonoscopy on file that is up to date from 2022 due repeat in 5 years due to less than desirable prep  We discussed PSA, prostate exam, and prostate cancer screening risks/benefits.     Skin cancer screening: Check your skin regularly for new changes, growing lesions, or other lesions of concern Come in for evaluation if you have skin lesions of concern.  Lung cancer screening: If you have a greater than 20 pack year history of tobacco use, then you may qualify for lung cancer screening with a chest CT scan.    Please call your insurance company to inquire about coverage for this test.  We currently don't have screenings for other cancers besides breast, cervical, colon, and lung cancers.  If you have a strong family history of cancer or have other cancer screening concerns, please let me know.    Bone health: Get at least 150 minutes of aerobic exercise weekly Get weight bearing exercise at least once weekly Bone density test:  A bone density test is an imaging test that uses a type of X-ray to measure the amount of calcium and other minerals in your bones. The test may be used to diagnose or screen you for a condition that causes weak or thin bones (osteoporosis), predict your risk for a broken bone (fracture), or determine how well your osteoporosis treatment is working. The bone density test is recommended for females 44 and older, or females or males <00 if certain risk factors such as thyroid disease, long term use of steroids such as for asthma or rheumatological issues, vitamin D deficiency, estrogen deficiency, family history of osteoporosis, self or family history of fragility fracture in first degree relative.    Heart health: Get at least 150 minutes of aerobic exercise weekly Limit alcohol It is important to maintain a healthy blood pressure and healthy cholesterol numbers  Heart disease screening: Screening for heart disease includes screening for blood pressure, fasting lipids, glucose/diabetes screening, BMI height to weight ratio, reviewed of smoking status, physical activity, and diet.    Goals include blood pressure 120/80 or less, maintaining a healthy lipid/cholesterol profile, preventing diabetes or keeping diabetes numbers under good  control, not smoking or using tobacco products, exercising most days per week or at least 150 minutes per week of exercise, and eating healthy variety of fruits and vegetables, healthy oils, and avoiding unhealthy food choices like fried food, fast  food, high sugar and high cholesterol foods.    I reviewed the 2022 CT coronary calcium score that was 0 thankfully    Medical care options: I recommend you continue to seek care here first for routine care.  We try really hard to have available appointments Monday through Friday daytime hours for sick visits, acute visits, and physicals.  Urgent care should be used for after hours and weekends for significant issues that cannot wait till the next day.  The emergency department should be used for significant potentially life-threatening emergencies.  The emergency department is expensive, can often have long wait times for less significant concerns, so try to utilize primary care, urgent care, or telemedicine when possible to avoid unnecessary trips to the emergency department.  Virtual visits and telemedicine have been introduced since the pandemic started in 2020, and can be convenient ways to receive medical care.  We offer virtual appointments as well to assist you in a variety of options to seek medical care.    Separate significant issues discussed:  Problem List Items Addressed This Visit     Family history of premature CAD   Chronic low back pain    I recommend a more consistent program of back strengthening exercises, stretching and continue aerobic exercise such as walking or jogging or bicycling or swimming.  I reviewed 2017 x-ray lumbar spine that was normal.  Consider referral to physical therapy.  He will let me know if he wants to pursue this.  I will work on a letter for him to use through [facility] account to pay for massage therapy as it would benefit his back pains and other aches.       Vaccine counseling   Chronic pain of right knee   Family history of multiple myeloma   Encounter for health maintenance examination in adult   Relevant Orders   Comprehensive metabolic panel   CBC with Differential/Platelet   Lipid panel   PSA   Urinalysis   Screening for prostate cancer    Relevant Orders   PSA   Chronic pain of both shoulders    Range of motion normal, no obvious deformity on exam.  Consider physical therapy referral, regular strengthening exercises.  He will consider and let me know       Screening for lipid disorders   Relevant Orders   Lipid panel   Chronic cough - Primary    CT coronary calcium score last year showed no abnormal findings of the lungs.  We discussed differential for chronic cough.  Repeat spirometry today.  Advise he try separate trial of antihistamine such as Zyrtec over-the-counter for couple weeks and then separate trial of acid reflux medicine for a few weeks such as famotidine over-the-counter.  If the cough persist despite these things then consider referral to pulmonology.       Relevant Orders   Spirometry with graph   Former smoker

## 2022-02-12 NOTE — Assessment & Plan Note (Addendum)
I recommend a more consistent program of back strengthening exercises, stretching and continue aerobic exercise such as walking or jogging or bicycling or swimming.  I reviewed 2017 x-ray lumbar spine that was normal.  Consider referral to physical therapy.  He will let me know if he wants to pursue this.  I will work on a letter for him to use through [facility] account to pay for massage therapy as it would benefit his back pains and other aches.

## 2022-02-12 NOTE — Assessment & Plan Note (Signed)
Range of motion normal, no obvious deformity on exam.  Consider physical therapy referral, regular strengthening exercises.  He will consider and let me know

## 2022-02-12 NOTE — Assessment & Plan Note (Signed)
Shingles vaccine:  I recommend you have a shingles vaccine to help prevent shingles or herpes zoster outbreak.   Please call your insurer to inquire about coverage for the Shingrix vaccine given in 2 doses.   Some insurers cover this vaccine after age 52, some cover this after age 60.  If your insurer covers this, then call to schedule appointment to have this vaccine here.  

## 2022-02-12 NOTE — Assessment & Plan Note (Signed)
CT coronary calcium score last year showed no abnormal findings of the lungs.  We discussed differential for chronic cough.  Repeat spirometry today.  Advise he try separate trial of antihistamine such as Zyrtec over-the-counter for couple weeks and then separate trial of acid reflux medicine for a few weeks such as famotidine over-the-counter.  If the cough persist despite these things then consider referral to pulmonology.

## 2022-02-12 NOTE — Assessment & Plan Note (Signed)
Consider follow-up with orthopedics

## 2022-02-13 LAB — COMPREHENSIVE METABOLIC PANEL
ALT: 28 IU/L (ref 0–44)
AST: 21 IU/L (ref 0–40)
Albumin/Globulin Ratio: 2 (ref 1.2–2.2)
Albumin: 4.7 g/dL (ref 3.8–4.9)
Alkaline Phosphatase: 64 IU/L (ref 44–121)
BUN/Creatinine Ratio: 11 (ref 9–20)
BUN: 11 mg/dL (ref 6–24)
Bilirubin Total: 0.7 mg/dL (ref 0.0–1.2)
CO2: 26 mmol/L (ref 20–29)
Calcium: 9.3 mg/dL (ref 8.7–10.2)
Chloride: 101 mmol/L (ref 96–106)
Creatinine, Ser: 1.04 mg/dL (ref 0.76–1.27)
Globulin, Total: 2.3 g/dL (ref 1.5–4.5)
Glucose: 94 mg/dL (ref 70–99)
Potassium: 4.6 mmol/L (ref 3.5–5.2)
Sodium: 142 mmol/L (ref 134–144)
Total Protein: 7 g/dL (ref 6.0–8.5)
eGFR: 87 mL/min/{1.73_m2} (ref >59)

## 2022-02-13 LAB — CBC WITH DIFFERENTIAL/PLATELET
Basophils Absolute: 0.1 10*3/uL (ref 0.0–0.2)
Basos: 1 %
EOS (ABSOLUTE): 0.3 10*3/uL (ref 0.0–0.4)
Eos: 5 %
Hematocrit: 44.4 % (ref 37.5–51.0)
Hemoglobin: 15.2 g/dL (ref 13.0–17.7)
Immature Grans (Abs): 0 10*3/uL (ref 0.0–0.1)
Immature Granulocytes: 0 %
Lymphocytes Absolute: 1.6 10*3/uL (ref 0.7–3.1)
Lymphs: 30 %
MCH: 30.6 pg (ref 26.6–33.0)
MCHC: 34.2 g/dL (ref 31.5–35.7)
MCV: 90 fL (ref 79–97)
Monocytes Absolute: 0.5 10*3/uL (ref 0.1–0.9)
Monocytes: 11 %
Neutrophils Absolute: 2.7 10*3/uL (ref 1.4–7.0)
Neutrophils: 53 %
Platelets: 218 10*3/uL (ref 150–450)
RBC: 4.96 x10E6/uL (ref 4.14–5.80)
RDW: 12.7 % (ref 11.6–15.4)
WBC: 5.1 10*3/uL (ref 3.4–10.8)

## 2022-02-13 LAB — LIPID PANEL
Chol/HDL Ratio: 5.2 ratio — ABNORMAL HIGH (ref 0.0–5.0)
Cholesterol, Total: 187 mg/dL (ref 100–199)
HDL: 36 mg/dL — ABNORMAL LOW (ref >39)
LDL Chol Calc (NIH): 127 mg/dL — ABNORMAL HIGH (ref 0–99)
Triglycerides: 134 mg/dL (ref 0–149)
VLDL Cholesterol Cal: 24 mg/dL (ref 5–40)

## 2022-02-13 LAB — PSA: Prostate Specific Ag, Serum: 3.9 ng/mL (ref 0.0–4.0)

## 2022-03-13 ENCOUNTER — Ambulatory Visit: Payer: BC Managed Care – PPO | Admitting: Medical

## 2022-03-14 ENCOUNTER — Encounter: Payer: Self-pay | Admitting: Medical

## 2022-03-14 ENCOUNTER — Ambulatory Visit
Admission: RE | Admit: 2022-03-14 | Discharge: 2022-03-14 | Disposition: A | Discharge status: Home / Self Care | Payer: BC Managed Care – PPO | Source: Ambulatory Visit | Attending: Medical | Admitting: Medical

## 2022-03-14 ENCOUNTER — Ambulatory Visit: Payer: BC Managed Care – PPO | Admitting: Medical

## 2022-03-14 VITALS — BP 120/82 | HR 99 | Temp 98.3°F | Wt 158.6 lb

## 2022-03-14 DIAGNOSIS — R053 Chronic cough: Secondary | ICD-10-CM

## 2022-03-14 DIAGNOSIS — M79621 Pain in right upper arm: Secondary | ICD-10-CM

## 2022-03-14 DIAGNOSIS — Z8249 Family history of ischemic heart disease and other diseases of the circulatory system: Secondary | ICD-10-CM | POA: Diagnosis not present

## 2022-03-14 DIAGNOSIS — G8929 Other chronic pain: Secondary | ICD-10-CM

## 2022-03-14 DIAGNOSIS — M25512 Pain in left shoulder: Secondary | ICD-10-CM

## 2022-03-14 DIAGNOSIS — R972 Elevated prostate specific antigen [PSA]: Secondary | ICD-10-CM | POA: Insufficient documentation

## 2022-03-14 DIAGNOSIS — M25511 Pain in right shoulder: Secondary | ICD-10-CM

## 2022-03-14 DIAGNOSIS — Z7185 Encounter for immunization safety counseling: Secondary | ICD-10-CM

## 2022-03-14 HISTORY — DX: Pain in right upper arm: M79.621

## 2022-03-14 NOTE — Patient Instructions (Signed)
Please go to [facility] for your chest xray.   Their hours are 8am - 4:30 pm Monday - Friday.  Take your insurance card with you. ° °[facility] °[phone] ° °[address], [address] °[place], [place] [address] ° °[address] °[place],  [address] ° ° °

## 2022-03-14 NOTE — Addendum Note (Signed)
Addended by: Jac Canavan on: 03/14/2022 02:47 PM   Modules accepted: Orders

## 2022-03-14 NOTE — Progress Notes (Signed)
Subjective:  Donald Mcdonald is a 51 y.o. male who presents for Chief Complaint  Patient presents with   other    Rt. Shoulder stiff and pain under rt. Arm pit area      Here for recheck.  At his recent physical his PSA had bumped up prior to last year but still less than 4.  Here to recheck on this.  No urinary concerns.  His cholesterol is still showing low HDL and elevated LDL.  He notes that he does not eat meat, he eats pretty healthy and is exercising.  He is still having pains in his bilateral shoulders and right armpit.  He has not heard back about Ortho consult per our referral last visit.  He wants to make sure he has no mass or lump in his armpits since his father has a history of multiple myeloma.  Unfortunately both of his parents recently became ill.  His mom was just hospitalized with sepsis.  His father collapsed and was taken to the emergency department as well recently.  Although his cough is improved he still has some residual chronic cough that we discussed last visit.  No other aggravating or relieving factors.    No other c/o.  Past Medical History:  Diagnosis Date   Constipation    Depression    Dr. Yehuda Budd   Current Outpatient Medications on File Prior to Visit  Medication Sig Dispense Refill   Acetylcarnitine HCl (ACETYL L-CARNITINE) 500 MG CAPS      buPROPion (WELLBUTRIN SR) 150 MG 12 hr tablet Take 150 mg by mouth daily.     fluvoxaMINE (LUVOX) 100 MG tablet Take 100 mg by mouth at bedtime.     Glucosamine Sulfate 1000 MG TABS Take 750 mg by mouth 2 (two) times daily.     Misc Natural Products (TURMERIC CURCUMIN) CAPS      Multiple Vitamin (MULTIVITAMIN) tablet Take 1 tablet by mouth daily.     Omega-3 1000 MG CAPS      COLOSTRUM PO      No current facility-administered medications on file prior to visit.     The following portions of the patient's history were reviewed and updated as appropriate: allergies, current medications, past family  history, past medical history, past social history, past surgical history and problem list.  ROS Otherwise as in subjective above    Objective: BP 120/82   Pulse 99   Temp 98.3 F (36.8 C)   Wt 158 lb 9.6 oz (71.9 kg)   BMI 22.28 kg/m   General appearance: alert, no distress, well developed, well nourished Neck: supple, no lymphadenopathy, no thyromegaly, no masses Axila - no obvious mass or nodule or lymphadenopathy Chest wall without mass or deformity Skin: small 83m flesh colored papule posterior to ear lobe on right, no other skin lesion or lymphadenopathy No pain in the axilla range of motion of shoulders, no obvious deformity, no swelling, no color change of shoulders or upper body and extremities    Assessment: Encounter Diagnoses  Name Primary?   Elevated PSA Yes   Family history of premature CAD    Vaccine counseling    Chronic cough    Pain in right axilla    Chronic pain of both shoulders      Plan: Elevated PSA-we reviewed his prior PSA levels.  I suspect his recent PSA change from last year was transient.  Recheck level today.  Recent low HDL and LDL approaching 130 with a family history  of premature CAD.  He had a CT coronary calcium test May 2022 that had a score of 0.  He does not want to start medication for prevention such as statin.  He will continue to work on healthy eating habits, low-cholesterol diet and regular exercise  Advise he again check insurance about shingles vaccine  Chronic cough-advised to go for baseline chest x-ray  Bilateral shoulder pain-follow-up with orthopedics as referred  Right axilla discomfort-no obvious lump or mass.  May be related to shoulder pain.  For now follow-up orthopedics.  Donald Mcdonald was seen today for other.  Diagnoses and all orders for this visit:  Elevated PSA -     PSA, total and free  Family history of premature CAD  Vaccine counseling  Chronic cough -     DG Chest 2 View; Future  Pain in right  axilla  Chronic pain of both shoulders    Follow up: pending xray, ortho consult, lab

## 2022-03-15 LAB — PSA, TOTAL AND FREE
PSA, Free Pct: 18 %
PSA, Free: 0.18 ng/mL
Prostate Specific Ag, Serum: 1 ng/mL (ref 0.0–4.0)

## 2022-05-11 ENCOUNTER — Telehealth: Payer: Self-pay | Admitting: Medical

## 2022-05-11 NOTE — Telephone Encounter (Signed)
Was trying to call pt to ask if I could move his appointment up to 9:45 am and make it a 30 minute appointment due to the multiple concerns. If pt calls back see if this is still available if not he will just have to keep the 15 minutes.

## 2022-05-14 ENCOUNTER — Ambulatory Visit: Payer: BC Managed Care – PPO | Admitting: Medical

## 2022-05-14 VITALS — BP 110/68 | HR 97 | Wt 159.2 lb

## 2022-05-14 DIAGNOSIS — M545 Low back pain, unspecified: Secondary | ICD-10-CM | POA: Diagnosis not present

## 2022-05-14 DIAGNOSIS — M25512 Pain in left shoulder: Secondary | ICD-10-CM

## 2022-05-14 DIAGNOSIS — Z807 Family history of other malignant neoplasms of lymphoid, hematopoietic and related tissues: Secondary | ICD-10-CM

## 2022-05-14 DIAGNOSIS — G8929 Other chronic pain: Secondary | ICD-10-CM | POA: Diagnosis not present

## 2022-05-14 DIAGNOSIS — M25511 Pain in right shoulder: Secondary | ICD-10-CM

## 2022-05-14 NOTE — Progress Notes (Unsigned)
Subjective:  Donald Mcdonald is a 51 y.o. male who presents for Chief Complaint  Patient presents with   discuss multiple issues    1- discuss pain in right armpit since last year 2-discuss shoulder xrays- from [facility] 3- discuss cholesterol level 4- discuss myeloma level due to family history 5- wants a letter for a massage to use his HSA card Declines shingles and flu shot     He has had ongoing pain in his armpit for the last year intermittent.  There is no specific mass or lymph node enlargement, no breast mass.  The pain comes and goes.  No specific aggravating factor.  No discoloration of skin.  No fevers, weight loss, no appetite change, no night sweats.  He continues to have questions about his shoulder pains.  He saw orthopedics and has questions about the x-rays today.  Still gets intermittent pain  He would like to use his HSA money is for massage therapy given some ongoing low back pain and shoulder pain.  She wants a letter stating that this is recommended  He wants to revisit whether we can do screening for multiple myeloma.  His father ended up passing away in June 2023 of a heart attack.  Father had a pacemaker and had multiple myeloma but he passed to the heart attack.  Overall Donald Mcdonald is concerned about his health and looking out for potential disease in the future  He notes that when he had x-rays orthopedics there was a comment about pips in his bones.  He does note this is a concern for multiple myeloma.  The orthopedic did not seem to be too worried about the findings  No other aggravating or relieving factors.    No other c/o.  Past Medical History:  Diagnosis Date   Constipation    Depression    Dr. Yehuda Mcdonald   Current Outpatient Medications on File Prior to Visit  Medication Sig Dispense Refill   Acetylcarnitine HCl (ACETYL L-CARNITINE) 500 MG CAPS      buPROPion (WELLBUTRIN SR) 150 MG 12 hr tablet Take 150 mg by mouth daily.     COLOSTRUM  PO      fluvoxaMINE (LUVOX) 100 MG tablet Take 100 mg by mouth at bedtime.     Glucosamine Sulfate 1000 MG TABS Take 750 mg by mouth 2 (two) times daily.     MAGNESIUM PO      Misc Natural Products (TURMERIC CURCUMIN) CAPS      Multiple Vitamin (MULTIVITAMIN) tablet Take 1 tablet by mouth daily.     NON FORMULARY Trametes Versicolor [place] Tail supplement     Omega-3 1000 MG CAPS      No current facility-administered medications on file prior to visit.   Family History  Problem Relation Age of Onset   Heart disease Mother 35       CABG, stents   Cancer Father        multiple myeloma   Heart disease Father 70       pacemaker   Diabetes Father    Cancer Maternal Grandfather    Cancer Paternal Grandmother    Colon cancer Paternal Grandmother    Esophageal cancer Neg Hx    Rectal cancer Neg Hx    Stomach cancer Neg Hx      The following portions of the patient's history were reviewed and updated as appropriate: allergies, current medications, past family history, past medical history, past social history, past surgical history and problem  list.  ROS Otherwise as in subjective above  Objective: BP 110/68   Pulse 97   Wt 159 lb 3.2 oz (72.2 kg)   BMI 22.36 kg/m   General appearance: alert, no distress, well developed, well nourished No obvious mass lump skin discoloration or other abnormality of the right axilla and right chest.  No supraclavicular nodes. Neck supple without obvious lymphadenopathy or mass Right shoulder nontender with normal range of motion, no deformity    Assessment: Encounter Diagnoses  Name Primary?   Family history of multiple myeloma Yes   Chronic pain of both shoulders    Chronic low back pain, unspecified back pain laterality, unspecified whether sciatica present      Plan: We discussed his concern for multiple myeloma in the family history and worries about his own health.  We discussed that there really is not a specific screening test  for this.  We discussed potential test that would help narrow down whether he has this.  Looking back we reviewed over his labs from his physician in May 2023.  We also reviewed back over his SPEP test that was normal from 2022.  We will update SPEP today at his request.  We discussed possible consult with oncology or genetic counseling.  He declines those consults at this time.  We discussed his concerns about his shoulder pain and right axillary pain.  I reviewed over his orthopedic notes.  Advised to follow-up with orthopedics  We discussed his right axillary discomfort.  There is no obvious mass or lymph node swollen.  We discussed that orthopedic felt like his pain is coming from the shoulder.  Advised the only other testing would be ultrasound or CT imaging of that region.  He declines those at this time.  Advised follow-up with orthopedics  We discussed his concerns for cholesterol and overall risk for heart disease.  I reviewed back over his CT cardiac test in May 2022 showing a cardiac score of 0.  Given his last lipid profile and given the CT cardiac score, I recommended either holding off on taking a statin unless he wanted to do low-dose 5 mg Crestor 3 days a week as a preventative measure.  He declines statin for now.  He is active and is a pescatarian\  I will write a letter on his behalf for help to use HSA funds for massage therapy which could benefit his chronic back and shoulder pain.   Donald Mcdonald was seen today for discuss multiple issues.  Diagnoses and all orders for this visit:  Family history of multiple myeloma -     Protein electrophoresis, serum  Chronic pain of both shoulders -     Protein electrophoresis, serum  Chronic low back pain, unspecified back pain laterality, unspecified whether sciatica present -     Protein electrophoresis, serum  Spent > 60 minutes face to face with patient in discussion of symptoms, evaluation, plan and recommendations as well as  additional time communicating with radiology and orthopedics.    Follow up: pending call back

## 2022-05-15 ENCOUNTER — Telehealth: Payer: Self-pay | Admitting: Medical

## 2022-05-15 ENCOUNTER — Encounter: Payer: Self-pay | Admitting: Medical

## 2022-05-15 NOTE — Telephone Encounter (Signed)
Waiting for a call back from [facility] orth[facility]

## 2022-05-15 NOTE — Progress Notes (Signed)
Pt will let us know if he wants to persue ultrasound or go back to ortho. Pt will get letter for massage off mychart

## 2022-05-15 NOTE — Progress Notes (Signed)
Left message for pt to call me back 

## 2022-05-15 NOTE — Telephone Encounter (Signed)
Please call [facility].  This patient was seen per our referral about shoulder pain.  We do not have a copy of the x-rays but the patient was concerned about something mention about pits in his bones.  Please have someone ask the physician he saw there to comment on whether there was any concern for lytic lesions of the bones.  Patient is worried about multiple myeloma given that his father had multiple myeloma.

## 2022-05-15 NOTE — Telephone Encounter (Signed)
Spoke to [place] at [employer] and she will send message to provider and then let us know. Dr is out of the office today

## 2022-05-16 LAB — PROTEIN ELECTROPHORESIS, SERUM
A/G Ratio: 1.3 (ref 0.7–1.7)
Albumin ELP: 4 g/dL (ref 2.9–4.4)
Alpha 1: 0.1 g/dL (ref 0.0–0.4)
Alpha 2: 0.6 g/dL (ref 0.4–1.0)
Beta: 1 g/dL (ref 0.7–1.3)
Gamma Globulin: 1.1 g/dL (ref 0.4–1.8)
Globulin, Total: 3 g/dL (ref 2.2–3.9)
Total Protein: 7 g/dL (ref 6.0–8.5)

## 2022-05-16 NOTE — Telephone Encounter (Signed)
Pt was notified and said thanks for going above and beyond for him

## 2022-05-16 NOTE — Telephone Encounter (Signed)
Dr. Tamera Punt called and said that patient had arthritis in joints on xray but no lesions or worried about Multiple Myeloma.

## 2022-05-17 ENCOUNTER — Encounter: Payer: Self-pay | Admitting: Internal Medicine

## 2022-07-18 ENCOUNTER — Encounter: Payer: Self-pay | Admitting: Medical

## 2022-07-25 ENCOUNTER — Telehealth: Payer: Self-pay | Admitting: Medical

## 2022-07-25 NOTE — Telephone Encounter (Signed)
Pt was on list to reschedule appt with [facility]. Pt called back and rescheduled appt from 02/19/2023 to 02/27/2023. He states that at his last appt he mentioned having his labs prior. He states that Audelia Acton was ok with that. Please place orders for labs in pt's chart so he can come in prior to appt to have labs drawn. I have placed in on the scheduled to have that done on 02/20/2022. Pt was advised that orders would need to be placed but since I have had a difficult time contacting him we would go ahead and schedule and I would send [facility] message advising when orders where placed. Please let me know.

## 2022-07-30 ENCOUNTER — Other Ambulatory Visit: Payer: Self-pay | Admitting: Medical

## 2022-07-30 DIAGNOSIS — Z807 Family history of other malignant neoplasms of lymphoid, hematopoietic and related tissues: Secondary | ICD-10-CM

## 2022-07-30 DIAGNOSIS — Z0000 Encounter for general adult medical examination without abnormal findings: Secondary | ICD-10-CM

## 2022-07-30 DIAGNOSIS — Z129 Encounter for screening for malignant neoplasm, site unspecified: Secondary | ICD-10-CM

## 2022-07-30 DIAGNOSIS — Z13220 Encounter for screening for lipoid disorders: Secondary | ICD-10-CM

## 2022-07-30 DIAGNOSIS — Z125 Encounter for screening for malignant neoplasm of prostate: Secondary | ICD-10-CM

## 2022-08-29 ENCOUNTER — Other Ambulatory Visit: Payer: Self-pay | Admitting: Medical

## 2022-08-29 DIAGNOSIS — G8929 Other chronic pain: Secondary | ICD-10-CM

## 2022-08-29 DIAGNOSIS — M79621 Pain in right upper arm: Secondary | ICD-10-CM

## 2022-09-21 ENCOUNTER — Telehealth: Payer: BC Managed Care – PPO | Admitting: Nurse Practitioner

## 2022-09-21 ENCOUNTER — Encounter: Payer: Self-pay | Admitting: Nurse Practitioner

## 2022-09-21 VITALS — Temp 99.5°F | Ht 71.0 in | Wt 165.0 lb

## 2022-09-21 DIAGNOSIS — U071 COVID-19: Secondary | ICD-10-CM

## 2022-09-21 MED ORDER — BACLOFEN 10 MG PO TABS: 10.0000 mg | ORAL_TABLET | Freq: Three times a day (TID) | ORAL | 0 refills | Status: DC | PRN

## 2022-09-21 MED ORDER — NIRMATRELVIR/RITONAVIR (PAXLOVID)TABLET: 3.0000 | ORAL_TABLET | Freq: Two times a day (BID) | ORAL | 0 refills | Status: AC

## 2022-09-26 ENCOUNTER — Other Ambulatory Visit: Payer: BC Managed Care – PPO

## 2022-09-26 DIAGNOSIS — U071 COVID-19: Secondary | ICD-10-CM | POA: Insufficient documentation

## 2022-09-26 NOTE — Progress Notes (Signed)
Virtual Visit Encounter mychart visit.   I connected with  Donald Mcdonald on 09/21/2022 at  2:45 PM EST by secure video and audio telemedicine application. I verified that I am speaking with the correct person using two identifiers.   I introduced myself as a Designer, jewellery with the practice. The limitations of evaluation and management by telemedicine discussed with the patient and the availability of in person appointments. The patient expressed verbal understanding and consent to proceed.  Participating parties in this visit include: Myself and patient  The patient is: Patient Location: Home I am: Provider Location: Office/Clinic Subjective:    CC and HPI: Donald Mcdonald is a 52 y.o. year old male presenting for new evaluation and treatment of [place]. Patient reports the following: Donald Mcdonald reports symptom onset 12/27 of cough, congestion, and fever. He has also been having unusual symptoms with "non-stop" hiccups. He tested yesterday for COVID and was positive. He is concerned about the hiccups and the risks that this could indicate severe respiratory concerns. He is not having any respiratory distress or associated symptoms.   Past medical history, Surgical history, Family history not pertinant except as noted below, Social history, Allergies, and medications have been entered into the medical record, reviewed, and corrections made.   Review of Systems:  All review of systems negative except what is listed in the HPI  Objective:    Alert and oriented x 4 Congestion and cough present.  Speaking in clear sentences with no shortness of breath. No distress.  Impression and Recommendations:    Problem List Items Addressed This Visit     COVID - Primary    COVID positive with recurrent persistent hiccups associated. This is a known, but rather rare, finding in many COVID positive patients. Reassurance provided that I have not seen or heard of conclusive evidence that this  indicates respiratory failure or worsening outcomes. A quick review of evidence based literature does not show that as a finding, either. Given patients age, we will start paxlovid today and I will add baclofen for treatment of hiccups. Supportive care measures discussed. He will follow-up if symptoms worsen or fail to improve.       Relevant Medications   nirmatrelvir/ritonavir (PAXLOVID) 20 x 150 MG & 10 x 100MG  TABS   baclofen (LIORESAL) 10 MG tablet    orders and follow up as documented in EMR I discussed the assessment and treatment plan with the patient. The patient was provided an opportunity to ask questions and all were answered. The patient agreed with the plan and demonstrated an understanding of the instructions.   The patient was advised to call back or seek an in-person evaluation if the symptoms worsen or if the condition fails to improve as anticipated.  Follow-Up: prn  I provided 15 minutes of non-face-to-face interaction with this non face-to-face encounter including intake, same-day documentation, and chart review.   Orma Render, NP , DNP, AGNP-c [facility]

## 2022-09-26 NOTE — Assessment & Plan Note (Signed)
COVID positive with recurrent persistent hiccups associated. This is a known, but rather rare, finding in many COVID positive patients. Reassurance provided that I have not seen or heard of conclusive evidence that this indicates respiratory failure or worsening outcomes. A quick review of evidence based literature does not show that as a finding, either. Given patients age, we will start paxlovid today and I will add baclofen for treatment of hiccups. Supportive care measures discussed. He will follow-up if symptoms worsen or fail to improve.

## 2022-09-27 ENCOUNTER — Encounter: Payer: Self-pay | Admitting: Nurse Practitioner

## 2022-10-02 ENCOUNTER — Other Ambulatory Visit: Payer: BC Managed Care – PPO

## 2022-10-11 ENCOUNTER — Encounter: Payer: Self-pay | Admitting: Medical

## 2022-10-12 ENCOUNTER — Ambulatory Visit
Admission: RE | Admit: 2022-10-12 | Discharge: 2022-10-12 | Disposition: A | Discharge status: Home / Self Care | Payer: BC Managed Care – PPO | Source: Ambulatory Visit | Attending: Medical | Admitting: Medical

## 2022-10-12 DIAGNOSIS — G8929 Other chronic pain: Secondary | ICD-10-CM

## 2022-10-12 DIAGNOSIS — M79621 Pain in right upper arm: Secondary | ICD-10-CM

## 2022-10-15 NOTE — Progress Notes (Signed)
Get in for visit to discuss and re-examine, possible labs

## 2022-10-16 ENCOUNTER — Encounter: Payer: Self-pay | Admitting: Medical

## 2022-10-16 ENCOUNTER — Ambulatory Visit: Payer: BC Managed Care – PPO | Admitting: Medical

## 2022-10-16 VITALS — BP 120/90 | HR 83 | Wt 162.4 lb

## 2022-10-16 DIAGNOSIS — M25512 Pain in left shoulder: Secondary | ICD-10-CM

## 2022-10-16 DIAGNOSIS — R9389 Abnormal findings on diagnostic imaging of other specified body structures: Secondary | ICD-10-CM

## 2022-10-16 DIAGNOSIS — M25511 Pain in right shoulder: Secondary | ICD-10-CM

## 2022-10-16 DIAGNOSIS — R937 Abnormal findings on diagnostic imaging of other parts of musculoskeletal system: Secondary | ICD-10-CM

## 2022-10-16 DIAGNOSIS — Z807 Family history of other malignant neoplasms of lymphoid, hematopoietic and related tissues: Secondary | ICD-10-CM

## 2022-10-16 DIAGNOSIS — R972 Elevated prostate specific antigen [PSA]: Secondary | ICD-10-CM

## 2022-10-16 DIAGNOSIS — M549 Dorsalgia, unspecified: Secondary | ICD-10-CM

## 2022-10-16 DIAGNOSIS — G8929 Other chronic pain: Secondary | ICD-10-CM

## 2022-10-16 NOTE — Progress Notes (Signed)
Subjective:  Donald Mcdonald is a 52 y.o. male who presents for Chief Complaint  Patient presents with   Follow-up    Follow up on MRI, results made patient nervous and has questions.     Here for f/u on recent MRI.  He is accompanied by his wife today.  Been dealing with pains in right shoulder area for at least 1 year plus, chronic low back pain for several years.  He denies weakness in the legs or arms.  He does get occasional numbness and a few toes on the right but otherwise no significant weakness numbness or tingling.  No blood in the stool, no incontinence, no fevers.  Not currently seeing chiropractor or massage therapy.   May want letter from [place] to use his HSA moneys towards chiropractor therapy or massage therapy.  Father had a history of multiple myeloma and he has been concern for several years about his risk for getting multiple myeloma.  His new MRI findings have him concern.  No recent fevers, body aches, chills, change in appetite.  No other aggravating or relieving factors.    No other c/o.  Past Medical History:  Diagnosis Date   Constipation    Depression    Dr. Yehuda Mcdonald   Current Outpatient Medications on File Prior to Visit  Medication Sig Dispense Refill   Acetylcarnitine HCl (ACETYL L-CARNITINE) 500 MG CAPS      buPROPion (WELLBUTRIN SR) 150 MG 12 hr tablet Take 150 mg by mouth daily.     COLOSTRUM PO      fluvoxaMINE (LUVOX) 100 MG tablet Take 100 mg by mouth at bedtime.     Glucosamine Sulfate 1000 MG TABS Take 750 mg by mouth 2 (two) times daily.     MAGNESIUM PO      Misc Natural Products (TURMERIC CURCUMIN) CAPS      Multiple Vitamin (MULTIVITAMIN) tablet Take 1 tablet by mouth daily.     NON FORMULARY Trametes Versicolor [place] Tail supplement     Omega-3 1000 MG CAPS      No current facility-administered medications on file prior to visit.     The following portions of the patient's history were reviewed and updated as appropriate:  allergies, current medications, past family history, past medical history, past social history, past surgical history and problem list.  ROS Otherwise as in subjective above    Objective: BP (!) 120/90   Pulse 83   Wt 162 lb 6.4 oz (73.7 kg)   BMI 22.65 kg/m   General appearance: alert, no distress, well developed, well nourished Nontender, normal range of motion, no mass, no lymphadenopathy Back nontender in general, nontender to percussion, no swelling, no deformity, range of motion is relatively full although he notes some pain in the lumbar spine with flexion and extension Neither shoulder was tender on palpation he does note some pain in the [facility] region of both shoulders, mild pain with range of motion which is pretty full of both shoulders, rest of arms nontender, no significant abnormal test with rotator cuff, he does note some mild tenderness of the right distal clavicle only, otherwise nontender of clavicles Legs nontender with normal range of motion Arms and legs neurovascularly intact, normal heel and toe walk, normal DTRs    Assessment: Encounter Diagnoses  Name Primary?   Elevated PSA measurement Yes   Bone marrow edema    Chronic pain of both shoulders    Chronic bilateral back pain, unspecified back location  Family history of multiple myeloma    Abnormal finding on imaging      Plan: We discussed his ongoing right axillary pain, bilateral shoulder pains, chronic low back pain that he has had for at least a year, most of his pains he has had for several years.  I had referred him to orthopedics last year by [facility] orth[facility] and they evaluated his ongoing shoulder pains.  Ultimately he asked me to do an MRI of his right shoulder recently.  We discussed the abnormal findings of bone marrow edema on his MRI of shoulder showing the edema of the clavicle.  We discussed possible causes for this.  Given his father's history of multiple myeloma he is concerned about  worst-case scenario  Additional labs as below today to help further evaluate the bone marrow edema as well as ruling out other causes of some of his pains.  If labs are completely normal we discussed following up with orthopedics for further discussion evaluation and additional imaging for his back no other treatment options for his chronic shoulder pain.  If labs are not normal we will call and discuss next steps.  He did have a PSA that increased compared to the last year.  Recheck PSA labs today.  Donald Mcdonald was seen today for follow-up.  Diagnoses and all orders for this visit:  Elevated PSA measurement -     PSA, total and free  Bone marrow edema -     CBC with Differential/Platelet -     Protein electrophoresis, serum -     Lactate dehydrogenase -     Sedimentation rate -     Comprehensive metabolic panel -     VITAMIN D 25 Hydroxy (Vit-D Deficiency, Fractures)  Chronic pain of both shoulders  Chronic bilateral back pain, unspecified back location  Family history of multiple myeloma -     Protein electrophoresis, serum -     Lactate dehydrogenase  Abnormal finding on imaging -     CBC with Differential/Platelet -     Protein electrophoresis, serum -     Lactate dehydrogenase -     Sedimentation rate -     Comprehensive metabolic panel -     VITAMIN D 25 Hydroxy (Vit-D Deficiency, Fractures)    Follow up: pending labs

## 2022-10-17 ENCOUNTER — Other Ambulatory Visit: Payer: Self-pay | Admitting: Medical

## 2022-10-17 MED ORDER — VITAMIN D 50 MCG (2000 UT) PO CAPS: 1.0000 | ORAL_CAPSULE | Freq: Every day | ORAL | 3 refills | Status: DC

## 2022-10-17 NOTE — Progress Notes (Signed)
Please call to get him a follow-up visit with [facility], Dr. Tamera Punt to discuss MRI abnormality of the clavicle, ongoing bilateral shoulder pain, other pains.  Please send a copy of his labs and my office note from yesterday to orthopedics.  1 lab test is still pending , electrophoresis.  Results sent through [facility]

## 2022-10-19 LAB — PSA, TOTAL AND FREE
PSA, Free Pct: 16.9 %
PSA, Free: 0.22 ng/mL
Prostate Specific Ag, Serum: 1.3 ng/mL (ref 0.0–4.0)

## 2022-10-19 LAB — COMPREHENSIVE METABOLIC PANEL
ALT: 29 IU/L (ref 0–44)
AST: 15 IU/L (ref 0–40)
Albumin/Globulin Ratio: 2 (ref 1.2–2.2)
Albumin: 4.7 g/dL (ref 3.8–4.9)
Alkaline Phosphatase: 61 IU/L (ref 44–121)
BUN/Creatinine Ratio: 12 (ref 9–20)
BUN: 11 mg/dL (ref 6–24)
Bilirubin Total: 0.5 mg/dL (ref 0.0–1.2)
CO2: 25 mmol/L (ref 20–29)
Calcium: 9.3 mg/dL (ref 8.7–10.2)
Chloride: 103 mmol/L (ref 96–106)
Creatinine, Ser: 0.95 mg/dL (ref 0.76–1.27)
Globulin, Total: 2.3 g/dL (ref 1.5–4.5)
Glucose: 98 mg/dL (ref 70–99)
Potassium: 4.9 mmol/L (ref 3.5–5.2)
Sodium: 141 mmol/L (ref 134–144)
Total Protein: 7 g/dL (ref 6.0–8.5)
eGFR: 96 mL/min/{1.73_m2} (ref >59)

## 2022-10-19 LAB — LACTATE DEHYDROGENASE: LDH: 151 IU/L (ref 121–224)

## 2022-10-19 LAB — CBC WITH DIFFERENTIAL/PLATELET
Basophils Absolute: 0.1 10*3/uL (ref 0.0–0.2)
Basos: 2 %
EOS (ABSOLUTE): 0.2 10*3/uL (ref 0.0–0.4)
Eos: 3 %
Hematocrit: 46.4 % (ref 37.5–51.0)
Hemoglobin: 15.9 g/dL (ref 13.0–17.7)
Immature Grans (Abs): 0 10*3/uL (ref 0.0–0.1)
Immature Granulocytes: 0 %
Lymphocytes Absolute: 1.4 10*3/uL (ref 0.7–3.1)
Lymphs: 29 %
MCH: 30.4 pg (ref 26.6–33.0)
MCHC: 34.3 g/dL (ref 31.5–35.7)
MCV: 89 fL (ref 79–97)
Monocytes Absolute: 0.5 10*3/uL (ref 0.1–0.9)
Monocytes: 10 %
Neutrophils Absolute: 2.6 10*3/uL (ref 1.4–7.0)
Neutrophils: 56 %
Platelets: 210 10*3/uL (ref 150–450)
RBC: 5.23 x10E6/uL (ref 4.14–5.80)
RDW: 12.2 % (ref 11.6–15.4)
WBC: 4.7 10*3/uL (ref 3.4–10.8)

## 2022-10-19 LAB — PROTEIN ELECTROPHORESIS, SERUM
A/G Ratio: 1.5 (ref 0.7–1.7)
Albumin ELP: 4.2 g/dL (ref 2.9–4.4)
Alpha 1: 0.2 g/dL (ref 0.0–0.4)
Alpha 2: 0.5 g/dL (ref 0.4–1.0)
Beta: 1 g/dL (ref 0.7–1.3)
Gamma Globulin: 1.1 g/dL (ref 0.4–1.8)
Globulin, Total: 2.8 g/dL (ref 2.2–3.9)

## 2022-10-19 LAB — SEDIMENTATION RATE: Sed Rate: 2 mm/hr (ref 0–30)

## 2022-10-19 LAB — VITAMIN D 25 HYDROXY (VIT D DEFICIENCY, FRACTURES): Vit D, 25-Hydroxy: 30.5 ng/mL (ref 30.0–100.0)

## 2022-10-22 NOTE — Progress Notes (Signed)
Results sent through MyChart

## 2022-10-25 DIAGNOSIS — G8929 Other chronic pain: Secondary | ICD-10-CM

## 2022-10-25 DIAGNOSIS — R9389 Abnormal findings on diagnostic imaging of other specified body structures: Secondary | ICD-10-CM

## 2022-10-26 ENCOUNTER — Telehealth: Payer: Self-pay | Admitting: Medical

## 2022-10-26 NOTE — Telephone Encounter (Signed)
Ortho Referral

## 2022-11-03 IMAGING — CT CT CARDIAC CORONARY ARTERY CALCIUM SCORE
4 series · 12 of 20 positions shown, 13 images · non-contrast
Comparison: None.
COMPARISON: None.

Addendum:
EXAM:
OVER-READ INTERPRETATION  CT CHEST

The following report is an over-read performed by radiologist Dr.
Erlin Antonio Romer [REDACTED] on 02/21/2021. This
over-read does not include interpretation of cardiac or coronary
anatomy or pathology. The coronary calcium score interpretation by
the cardiologist is attached.
CLINICAL DATA: Cardiovascular disease risk stratification
CT Coronary Calcium Score
TECHNIQUE: A gated, non-contrast computed tomography scan of the heart was
performed using 3mm slice thickness. Axial images were analyzed on a
dedicated workstation. Calcium scoring of the coronary arteries was
performed using the Agatston method.

[Series 2: casc 3.0 best diast 73 % (id) · axial · 0.39mm/px · z∈[+1132,+1212]mm · 3 of 55 slices shown, 4 images]
[im 14/55  vessel]
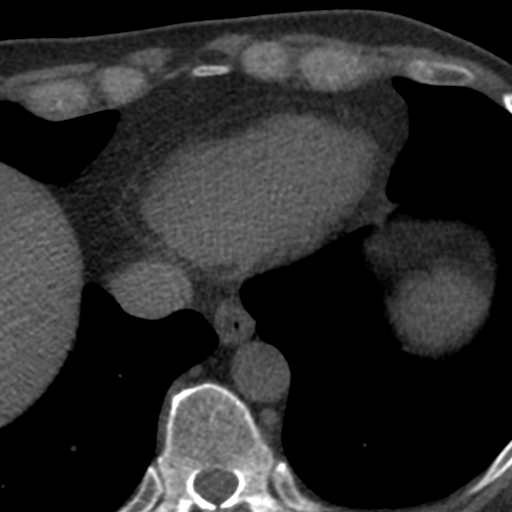
[im 14/55  lung]
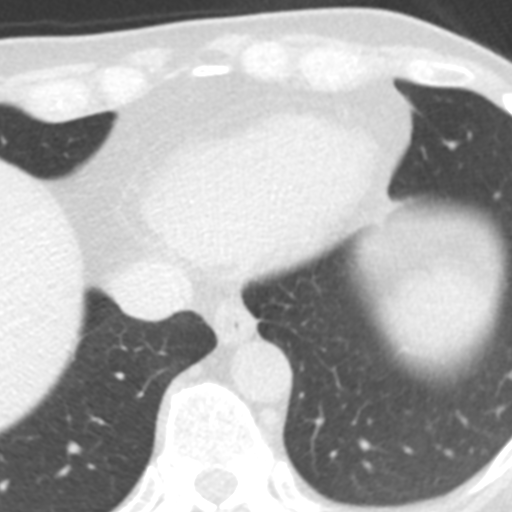
[im 28/55  vessel]
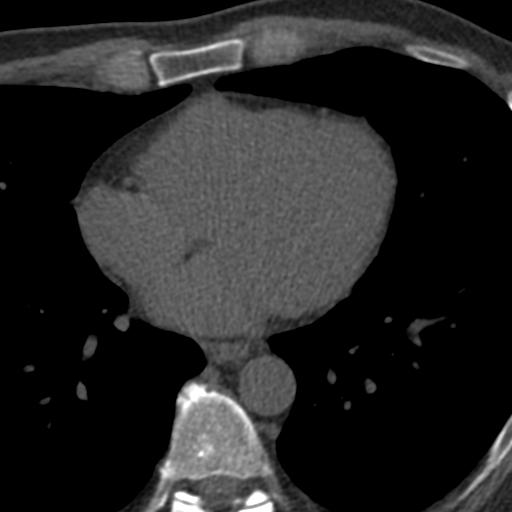
[im 41/55  vessel]
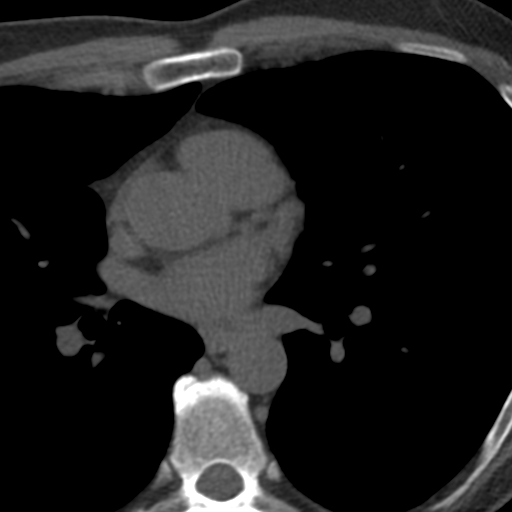

[Series 3: soft full fov 73 % · axial · 0.39mm/px · z∈[+1132,+1212]mm · 3 of 55 slices shown (1 of 2)]
[im 14/55  vessel]
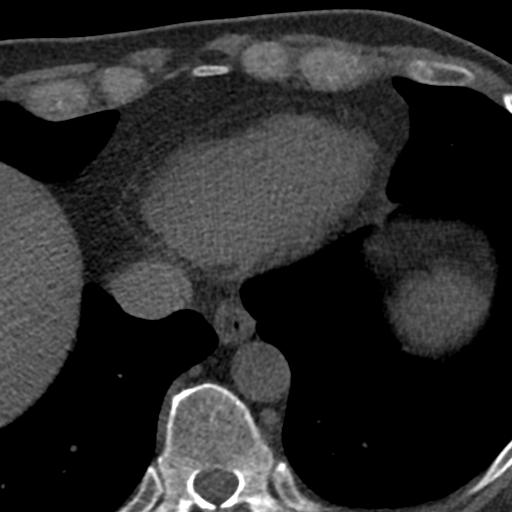
[im 28/55  vessel]
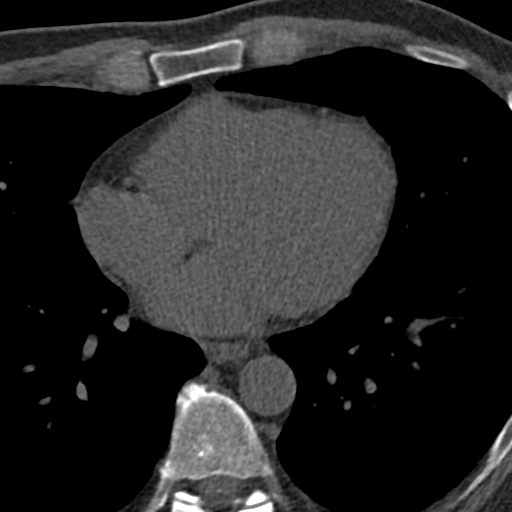
[im 41/55  vessel]
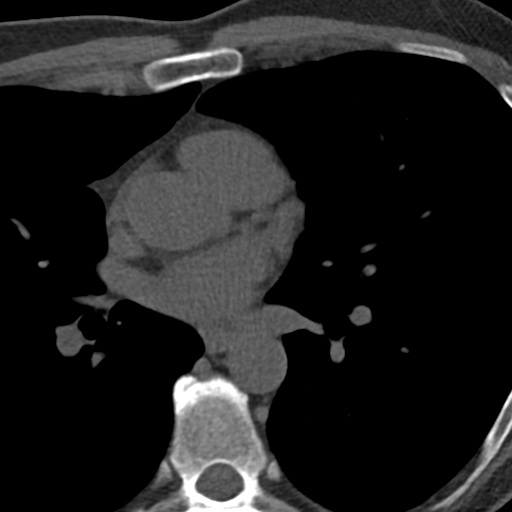

[Series 4: lungs 73 % · axial · 0.65mm/px · z∈[+1132,+1212]mm · 3 of 55 slices shown]
[im 14/55  vessel]
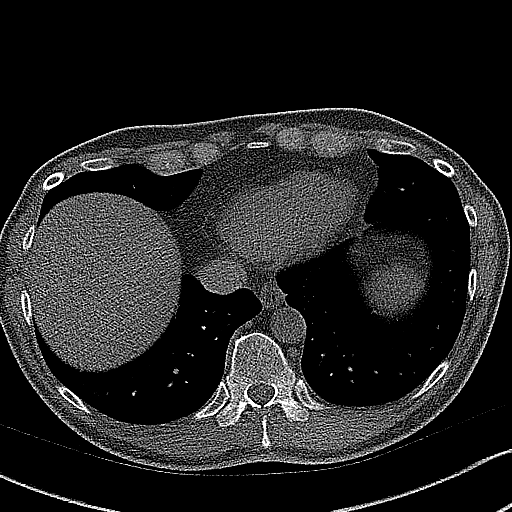
[im 28/55  vessel]
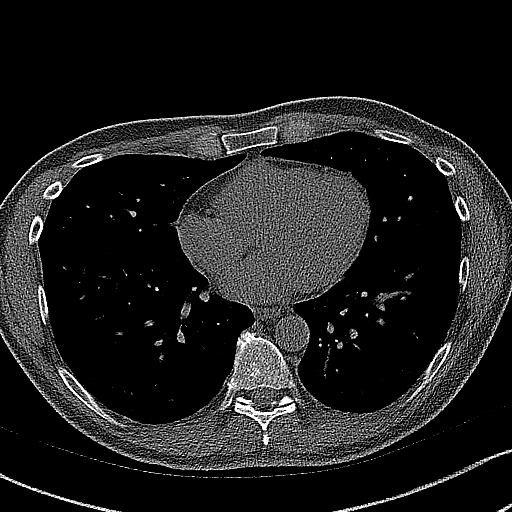
[im 41/55  vessel]
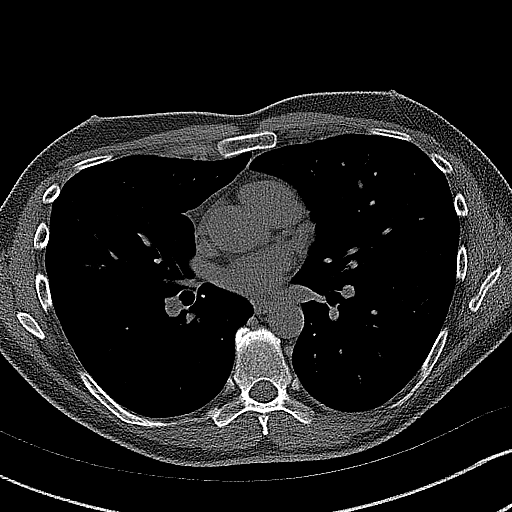

[Series 5: soft full fov 73 % · axial · 0.67mm/px · z∈[+1132,+1212]mm · 3 of 55 slices shown (2 of 2)]
[im 14/55  vessel]
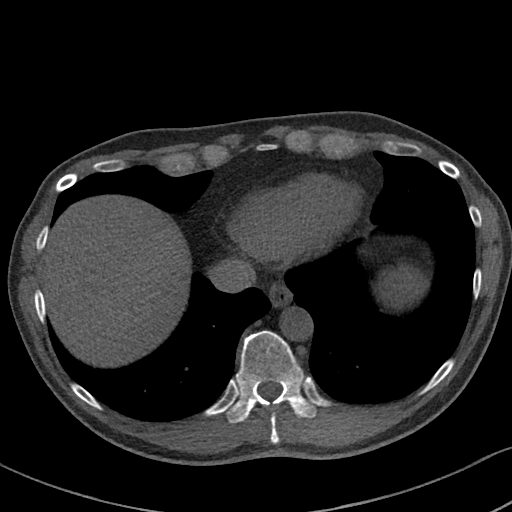
[im 28/55  vessel]
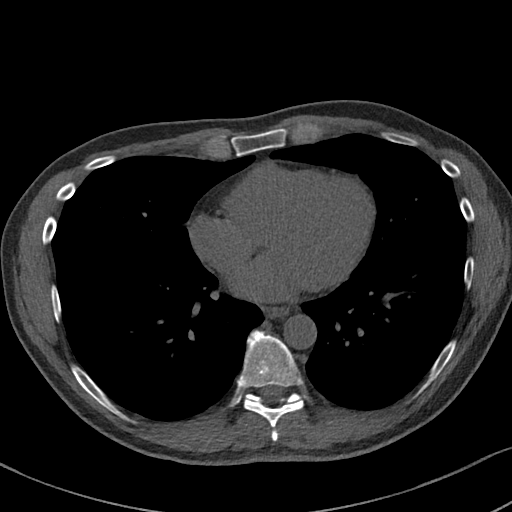
[im 41/55  vessel]
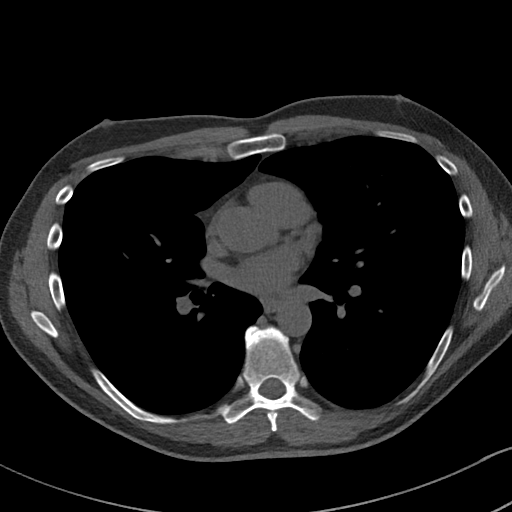

[12 of 20 positions shown; findings below may reference images not displayed]

FINDINGS: Within the visualized portions of the thorax there are no suspicious
appearing pulmonary nodules or masses, there is no acute
consolidative airspace disease, no pleural effusions, no
pneumothorax and no lymphadenopathy. Visualized portions of the
upper abdomen are unremarkable. There are no aggressive appearing
lytic or blastic lesions noted in the visualized portions of the
skeleton.
IMPRESSION: No significant incidental noncardiac findings are noted.
FINDINGS: Coronary arteries: Normal origins.

Coronary Calcium Score:

Left main: 0

Left anterior descending artery: 0

Left circumflex artery: 0

Right coronary artery: 0

Total: 0

Pericardium: Normal.

Ascending Aorta: Normal caliber. Ascending aorta measures 29mm at
the mid ascending aorta measured in an axial plane.

Non-cardiac: See separate report from [REDACTED].
IMPRESSION: Coronary calcium score of 0.



If CAC=0, it is reasonable to withhold statin therapy and reassess
in 5 to 10 years, as long as higher risk conditions are absent
(diabetes mellitus, family history of premature CHD in first degree
relatives (males <55 years; females <65 years), cigarette smoking,
or LDL >=190 mg/dL).

If CAC is 1 to 99, it is reasonable to initiate statin therapy for
patients >=55 years of age.

If CAC is >=100 or >=75th percentile, it is reasonable to initiate
statin therapy at any age.

Cardiology referral should be considered for patients with CAC
scores >=400 or >=75th percentile.

*8545 AHA/ACC/AACVPR/AAPA/ABC/ASBLEIDY/AUJLA/BOBMANUEL/Julita Koza/PETRENA/ALSHARIF/SUNGKYUN
Guideline on the Management of Blood Cholesterol: A Report of the
American College of Cardiology/American Heart Association Task Force
on Clinical Practice Guidelines. J Am Coll Cardiol.
6819;73(24):2582-2916.

*** End of Addendum ***
EXAM:
OVER-READ INTERPRETATION  CT CHEST

The following report is an over-read performed by radiologist Dr.
Erlin Antonio Romer [REDACTED] on 02/21/2021. This
over-read does not include interpretation of cardiac or coronary
anatomy or pathology. The coronary calcium score interpretation by
the cardiologist is attached.
FINDINGS: Within the visualized portions of the thorax there are no suspicious
appearing pulmonary nodules or masses, there is no acute
consolidative airspace disease, no pleural effusions, no
pneumothorax and no lymphadenopathy. Visualized portions of the
upper abdomen are unremarkable. There are no aggressive appearing
lytic or blastic lesions noted in the visualized portions of the
skeleton.
IMPRESSION: No significant incidental noncardiac findings are noted.

## 2022-11-08 NOTE — Telephone Encounter (Signed)
Donald Mcdonald  See 04/2022 letter about massage therapy that we sent him.   Please type up new similar letter for him, thanks  [place]

## 2022-11-14 ENCOUNTER — Encounter: Payer: Self-pay | Admitting: Medical

## 2022-12-06 ENCOUNTER — Encounter: Payer: Self-pay | Admitting: Medical

## 2022-12-06 DIAGNOSIS — R5383 Other fatigue: Secondary | ICD-10-CM | POA: Insufficient documentation

## 2022-12-06 DIAGNOSIS — E569 Vitamin deficiency, unspecified: Secondary | ICD-10-CM | POA: Insufficient documentation

## 2023-02-19 ENCOUNTER — Encounter: Payer: BC Managed Care – PPO | Admitting: Medical

## 2023-02-21 ENCOUNTER — Other Ambulatory Visit: Payer: BC Managed Care – PPO

## 2023-02-21 DIAGNOSIS — Z13220 Encounter for screening for lipoid disorders: Secondary | ICD-10-CM

## 2023-02-21 DIAGNOSIS — Z807 Family history of other malignant neoplasms of lymphoid, hematopoietic and related tissues: Secondary | ICD-10-CM

## 2023-02-21 DIAGNOSIS — Z125 Encounter for screening for malignant neoplasm of prostate: Secondary | ICD-10-CM

## 2023-02-21 DIAGNOSIS — Z129 Encounter for screening for malignant neoplasm, site unspecified: Secondary | ICD-10-CM

## 2023-02-21 DIAGNOSIS — Z0000 Encounter for general adult medical examination without abnormal findings: Secondary | ICD-10-CM

## 2023-02-27 ENCOUNTER — Ambulatory Visit: Payer: BC Managed Care – PPO | Admitting: Medical

## 2023-02-27 ENCOUNTER — Encounter: Payer: Self-pay | Admitting: Medical

## 2023-02-27 VITALS — BP 120/74 | HR 98 | Ht 70.5 in | Wt 166.0 lb

## 2023-02-27 DIAGNOSIS — Z125 Encounter for screening for malignant neoplasm of prostate: Secondary | ICD-10-CM

## 2023-02-27 DIAGNOSIS — E559 Vitamin D deficiency, unspecified: Secondary | ICD-10-CM | POA: Diagnosis not present

## 2023-02-27 DIAGNOSIS — L989 Disorder of the skin and subcutaneous tissue, unspecified: Secondary | ICD-10-CM | POA: Insufficient documentation

## 2023-02-27 DIAGNOSIS — E786 Lipoprotein deficiency: Secondary | ICD-10-CM

## 2023-02-27 DIAGNOSIS — Z0000 Encounter for general adult medical examination without abnormal findings: Secondary | ICD-10-CM | POA: Diagnosis not present

## 2023-02-27 HISTORY — DX: Disorder of the skin and subcutaneous tissue, unspecified: L98.9

## 2023-02-27 LAB — COMPREHENSIVE METABOLIC PANEL
ALT: 29 IU/L (ref 0–44)
AST: 19 IU/L (ref 0–40)
Albumin/Globulin Ratio: 2 (ref 1.2–2.2)
Albumin: 4.6 g/dL (ref 3.8–4.9)
Alkaline Phosphatase: 58 IU/L (ref 44–121)
BUN/Creatinine Ratio: 12 (ref 9–20)
BUN: 13 mg/dL (ref 6–24)
Bilirubin Total: 0.5 mg/dL (ref 0.0–1.2)
CO2: 24 mmol/L (ref 20–29)
Calcium: 9.4 mg/dL (ref 8.7–10.2)
Chloride: 102 mmol/L (ref 96–106)
Creatinine, Ser: 1.11 mg/dL (ref 0.76–1.27)
Globulin, Total: 2.3 g/dL (ref 1.5–4.5)
Glucose: 99 mg/dL (ref 70–99)
Potassium: 4.4 mmol/L (ref 3.5–5.2)
Sodium: 140 mmol/L (ref 134–144)
Total Protein: 6.9 g/dL (ref 6.0–8.5)
eGFR: 80 mL/min/{1.73_m2} (ref >59)

## 2023-02-27 LAB — PSA, TOTAL AND FREE
PSA, Free Pct: 22 %
PSA, Free: 0.22 ng/mL
Prostate Specific Ag, Serum: 1 ng/mL (ref 0.0–4.0)

## 2023-02-27 LAB — PROTEIN ELECTROPHORESIS, SERUM
A/G Ratio: 1.9 — ABNORMAL HIGH (ref 0.7–1.7)
Albumin ELP: 4.5 g/dL — ABNORMAL HIGH (ref 2.9–4.4)
Alpha 1: 0.1 g/dL (ref 0.0–0.4)
Alpha 2: 0.5 g/dL (ref 0.4–1.0)
Beta: 0.9 g/dL (ref 0.7–1.3)
Gamma Globulin: 0.9 g/dL (ref 0.4–1.8)
Globulin, Total: 2.4 g/dL (ref 2.2–3.9)

## 2023-02-27 LAB — LIPID PANEL
Chol/HDL Ratio: 4.7 ratio (ref 0.0–5.0)
Cholesterol, Total: 178 mg/dL (ref 100–199)
HDL: 38 mg/dL — ABNORMAL LOW (ref >39)
LDL Chol Calc (NIH): 116 mg/dL — ABNORMAL HIGH (ref 0–99)
Triglycerides: 132 mg/dL (ref 0–149)
VLDL Cholesterol Cal: 24 mg/dL (ref 5–40)

## 2023-02-27 LAB — CBC
Hematocrit: 46.3 % (ref 37.5–51.0)
Hemoglobin: 15.5 g/dL (ref 13.0–17.7)
MCH: 30.2 pg (ref 26.6–33.0)
MCHC: 33.5 g/dL (ref 31.5–35.7)
MCV: 90 fL (ref 79–97)
Platelets: 215 10*3/uL (ref 150–450)
RBC: 5.13 x10E6/uL (ref 4.14–5.80)
RDW: 12.7 % (ref 11.6–15.4)
WBC: 5.6 10*3/uL (ref 3.4–10.8)

## 2023-02-27 NOTE — Progress Notes (Signed)
Subjective:   HPI  Donald Mcdonald is a 52 y.o. male who presents for Chief Complaint  Patient presents with   Annual Exam    Cpe, had blood work the other day    Patient Care Team: Tiras Bianchini, Cleda Mccreedy as PCP - General (Family Medicine) Napoleon Form, MD as Consulting Physician (Gastroenterology) [facility], [place] Sees dentist Sees eye doctor Dr. Marsa Aris, GI  Concerns: Is pescetarian, eats almonds daily, does several supplements  Stretches some.  Does light exercise.   Does massage some.  No chiropractor.  Still gets occasional aches and pains.    Reviewed their medical, surgical, family, social, medication, and allergy history and updated chart as appropriate.  Past Medical History:  Diagnosis Date   Constipation    Depression    Dr. Wellington Hampshire    Past Surgical History:  Procedure Laterality Date   COLONOSCOPY  2022   repeat 5 years due to insufficient prep, Dr. Marsa Aris    Family History  Problem Relation Age of Onset   Heart disease Mother 83       CABG, stents   Cancer Father        multiple myeloma   Heart disease Father 64       pacemaker   Diabetes Father    Cancer Maternal Grandfather    Cancer Paternal Grandmother    Colon cancer Paternal Grandmother    Esophageal cancer Neg Hx    Rectal cancer Neg Hx    Stomach cancer Neg Hx      Current Outpatient Medications:    buPROPion (WELLBUTRIN SR) 150 MG 12 hr tablet, Take 150 mg by mouth daily., Disp: , Rfl:    COLOSTRUM PO, , Disp: , Rfl:    fluvoxaMINE (LUVOX) 100 MG tablet, Take 100 mg by mouth at bedtime., Disp: , Rfl:    Glucosamine Sulfate 1000 MG TABS, Take 750 mg by mouth 2 (two) times daily., Disp: , Rfl:    MAGNESIUM PO, , Disp: , Rfl:    Misc Natural Products (TURMERIC CURCUMIN) CAPS, , Disp: , Rfl:    Multiple Vitamin (MULTIVITAMIN) tablet, Take 1 tablet by mouth daily., Disp: , Rfl:    NON FORMULARY, Trametes Versicolor [place] Tail  supplement, Disp: , Rfl:    Omega-3 1000 MG CAPS, , Disp: , Rfl:    Red Yeast Rice 600 MG CAPS, , Disp: , Rfl:   No Known Allergies  Review of Systems  Constitutional:  Negative for chills, fever, malaise/fatigue and weight loss.  HENT:  Negative for congestion, ear pain, hearing loss, sore throat and tinnitus.   Eyes:  Negative for blurred vision, pain and redness.  Respiratory:  Negative for cough, hemoptysis and shortness of breath.   Cardiovascular:  Negative for chest pain, palpitations, orthopnea, claudication and leg swelling.  Gastrointestinal:  Negative for abdominal pain, blood in stool, constipation, diarrhea, nausea and vomiting.  Genitourinary:  Negative for dysuria, flank pain, frequency, hematuria and urgency.  Musculoskeletal:  Positive for back pain and myalgias. Negative for falls and joint pain.  Skin:  Negative for itching and rash.  Neurological:  Negative for dizziness, tingling, speech change, weakness and headaches.  Endo/Heme/Allergies:  Negative for polydipsia. Does not bruise/bleed easily.  Psychiatric/Behavioral:  Negative for depression and memory loss. The patient is not nervous/anxious and does not have insomnia.         02/27/2023   10:29 AM 05/14/2022   10:20 AM 02/12/2022    8:26 AM  02/09/2021    9:33 AM 11/15/2020   10:58 AM  Depression screen PHQ 2/9  Decreased Interest 0 0 1 0 0  Down, Depressed, Hopeless 0 0 1 0 0  PHQ - 2 Score 0 0 2 0 0  Altered sleeping   0    Tired, decreased energy   1    Change in appetite   0    Feeling bad or failure about yourself    0    Trouble concentrating   1    Moving slowly or fidgety/restless   0    Suicidal thoughts   0    PHQ-9 Score   4    Difficult doing work/chores   Not difficult at all          Objective:  BP 120/74   Pulse 98   Ht 5' 10.5" (1.791 m)   Wt 166 lb (75.3 kg)   BMI 23.48 kg/m   General appearance: alert, no distress, WD/WN, Caucasian male Skin: left lower orbit just under  eyelid with pedunculated 3mm x 4mm lesion, possible cutaneous horn vs verrucal lesions, on other worrisome findings.  scattered macules, no other worrisome lesions HEENT: normocephalic, conjunctiva/corneas normal, sclerae anicteric, PERRLA, EOMi, nares patent, no discharge or erythema, pharynx normal Neck: supple, no lymphadenopathy, no thyromegaly, no masses, normal ROM, no bruits Chest: non tender, normal shape and expansion Heart: RRR, normal S1, S2, no murmurs Lungs: CTA bilaterally, no wheezes, rhonchi, or rales Abdomen: +bs, soft, non tender, non distended, no masses, no hepatomegaly, no splenomegaly, no bruits Back: non tender, normal ROM, no scoliosis Musculoskeletal: knees unremarkable, upper extremities non tender, no obvious deformity, normal ROM throughout, lower extremities non tender, no obvious deformity, normal ROM throughout Extremities: no edema, no cyanosis, no clubbing Pulses: 2+ symmetric, upper and lower extremities, normal cap refill Neurological: alert, oriented x 3, CN2-12 intact, strength normal upper extremities and lower extremities, sensation normal throughout, DTRs 2+ throughout, no cerebellar signs, gait normal Psychiatric: normal affect, behavior normal, pleasant  GU: normal male external genitalia,circumcised, nontender, no masses, no hernia, no lymphadenopathy Rectal: deferred/declined     Assessment and Plan :   Encounter Diagnoses  Name Primary?   Routine general medical examination at a health care facility Yes   Elevated PSA measurement    Skin lesion    Vitamin D deficiency    Low HDL (under 40)     This visit was a preventative care visit, also known as wellness visit or routine physical.   Topics typically include healthy lifestyle, diet, exercise, preventative care, vaccinations, sick and well care, proper use of emergency dept and after hours care, as well as other concerns.     Recommendations: Continue to return yearly for your annual  wellness and preventative care visits.  This gives [place] a chance to discuss healthy lifestyle, exercise, vaccinations, review your chart record, and perform screenings where appropriate.  I recommend you see your eye doctor yearly for routine vision care.  I recommend you see your dentist yearly for routine dental care including hygiene visits twice yearly.   Vaccination recommendations were reviewed Immunization History  Administered Date(s) Administered   Influenza Split 08/03/2021   Influenza,inj,Quad PF,6+ Mos 10/06/2022   PFIZER(Purple Top)SARS-COV-2 Vaccination 12/04/2019, 12/28/2019, 07/27/2020   Pfizer Covid-19 Vaccine Bivalent Booster 76yrs & up 06/15/2021, 06/26/2022   Td 02/10/2015   Tdap 02/10/2015    Shingles vaccine:  I recommend you have a shingles vaccine to help prevent shingles or herpes zoster  outbreak.   Please call your insurer to inquire about coverage for the Shingrix vaccine given in 2 doses.   Some insurers cover this vaccine after age 23, some cover this after age 65.  If your insurer covers this, then call to schedule appointment to have this vaccine here.    Screening for cancer: Colon cancer screening: Reviewed 2022 colonoscopy.  We discussed PSA, prostate exam, and prostate cancer screening risks/benefits.     Skin cancer screening: Check your skin regularly for new changes, growing lesions, or other lesions of concern Come in for evaluation if you have skin lesions of concern.  Lung cancer screening: If you have a greater than 20 pack year history of tobacco use, then you may qualify for lung cancer screening with a chest CT scan.   Please call your insurance company to inquire about coverage for this test.  We currently don't have screenings for other cancers besides breast, cervical, colon, and lung cancers.  If you have a strong family history of cancer or have other cancer screening concerns, please let me know.    Bone health: Get at least 150  minutes of aerobic exercise weekly Get weight bearing exercise at least once weekly Bone density test:  A bone density test is an imaging test that uses a type of X-ray to measure the amount of calcium and other minerals in your bones. The test may be used to diagnose or screen you for a condition that causes weak or thin bones (osteoporosis), predict your risk for a broken bone (fracture), or determine how well your osteoporosis treatment is working. The bone density test is recommended for females 65 and older, or females or males <65 if certain risk factors such as thyroid disease, long term use of steroids such as for asthma or rheumatological issues, vitamin D deficiency, estrogen deficiency, family history of osteoporosis, self or family history of fragility fracture in first degree relative.    Heart health: Get at least 150 minutes of aerobic exercise weekly Limit alcohol It is important to maintain a healthy blood pressure and healthy cholesterol numbers  Heart disease screening: Screening for heart disease includes screening for blood pressure, fasting lipids, glucose/diabetes screening, BMI height to weight ratio, reviewed of smoking status, physical activity, and diet.    Goals include blood pressure 120/80 or less, maintaining a healthy lipid/cholesterol profile, preventing diabetes or keeping diabetes numbers under good control, not smoking or using tobacco products, exercising most days per week or at least 150 minutes per week of exercise, and eating healthy variety of fruits and vegetables, healthy oils, and avoiding unhealthy food choices like fried food, fast food, high sugar and high cholesterol foods.    Other tests may possibly include EKG test, CT coronary calcium score, echocardiogram, exercise treadmill stress test.   2022 CT calcium score of zero    Medical care options: I recommend you continue to seek care here first for routine care.  We try really hard to have  available appointments Monday through Friday daytime hours for sick visits, acute visits, and physicals.  Urgent care should be used for after hours and weekends for significant issues that cannot wait till the next day.  The emergency department should be used for significant potentially life-threatening emergencies.  The emergency department is expensive, can often have long wait times for less significant concerns, so try to utilize primary care, urgent care, or telemedicine when possible to avoid unnecessary trips to the emergency department.  Virtual visits  and telemedicine have been introduced since the pandemic started in 2020, and can be convenient ways to receive medical care.  We offer virtual appointments as well to assist you in a variety of options to seek medical care.    Separate significant issues discussed: Skin lesions - referral to dermatology at his request. He will call back with name of dermatologist he wants to see  HDL low - discussed strategies to improve this.  He just recently started a different supplement. Consider fasting lipid in 3-4 months  Vitamin D deficiency - on supplement . 09/2022 low level per lab.  Consider recheck lab in 3-4 months for vit D level.  I reviewed recent labs. CMET normal  PSA normal Lipids with low HDL 38, LDL 116 CBC was normal 09/2022 with low end normal vitamin D    [place] was seen today for annual exam.  Diagnoses and all orders for this visit:  Routine general medical examination at a health care facility  Elevated PSA measurement  Skin lesion -     Cancel: Ambulatory referral to Dermatology  Vitamin D deficiency -     VITAMIN D 25 Hydroxy (Vit-D Deficiency, Fractures); Future  Low HDL (under 40) -     Lipid panel; Future    Follow-up pending labs, yearly for physical

## 2023-02-28 NOTE — Progress Notes (Signed)
Results sent through MyChart

## 2023-03-01 NOTE — Addendum Note (Signed)
Addended by: Herminio Commons A on: 03/01/2023 10:45 AM   Modules accepted: Orders

## 2023-07-10 ENCOUNTER — Encounter: Payer: Self-pay | Admitting: Nurse Practitioner

## 2023-11-24 IMAGING — CR DG CHEST 2V
2 series · 2 of 2 positions shown · non-contrast
Comparison: July 11, 2018.

CLINICAL DATA: Chronic cough.

EXAM:
CHEST - 2 VIEW

[w chest pa]
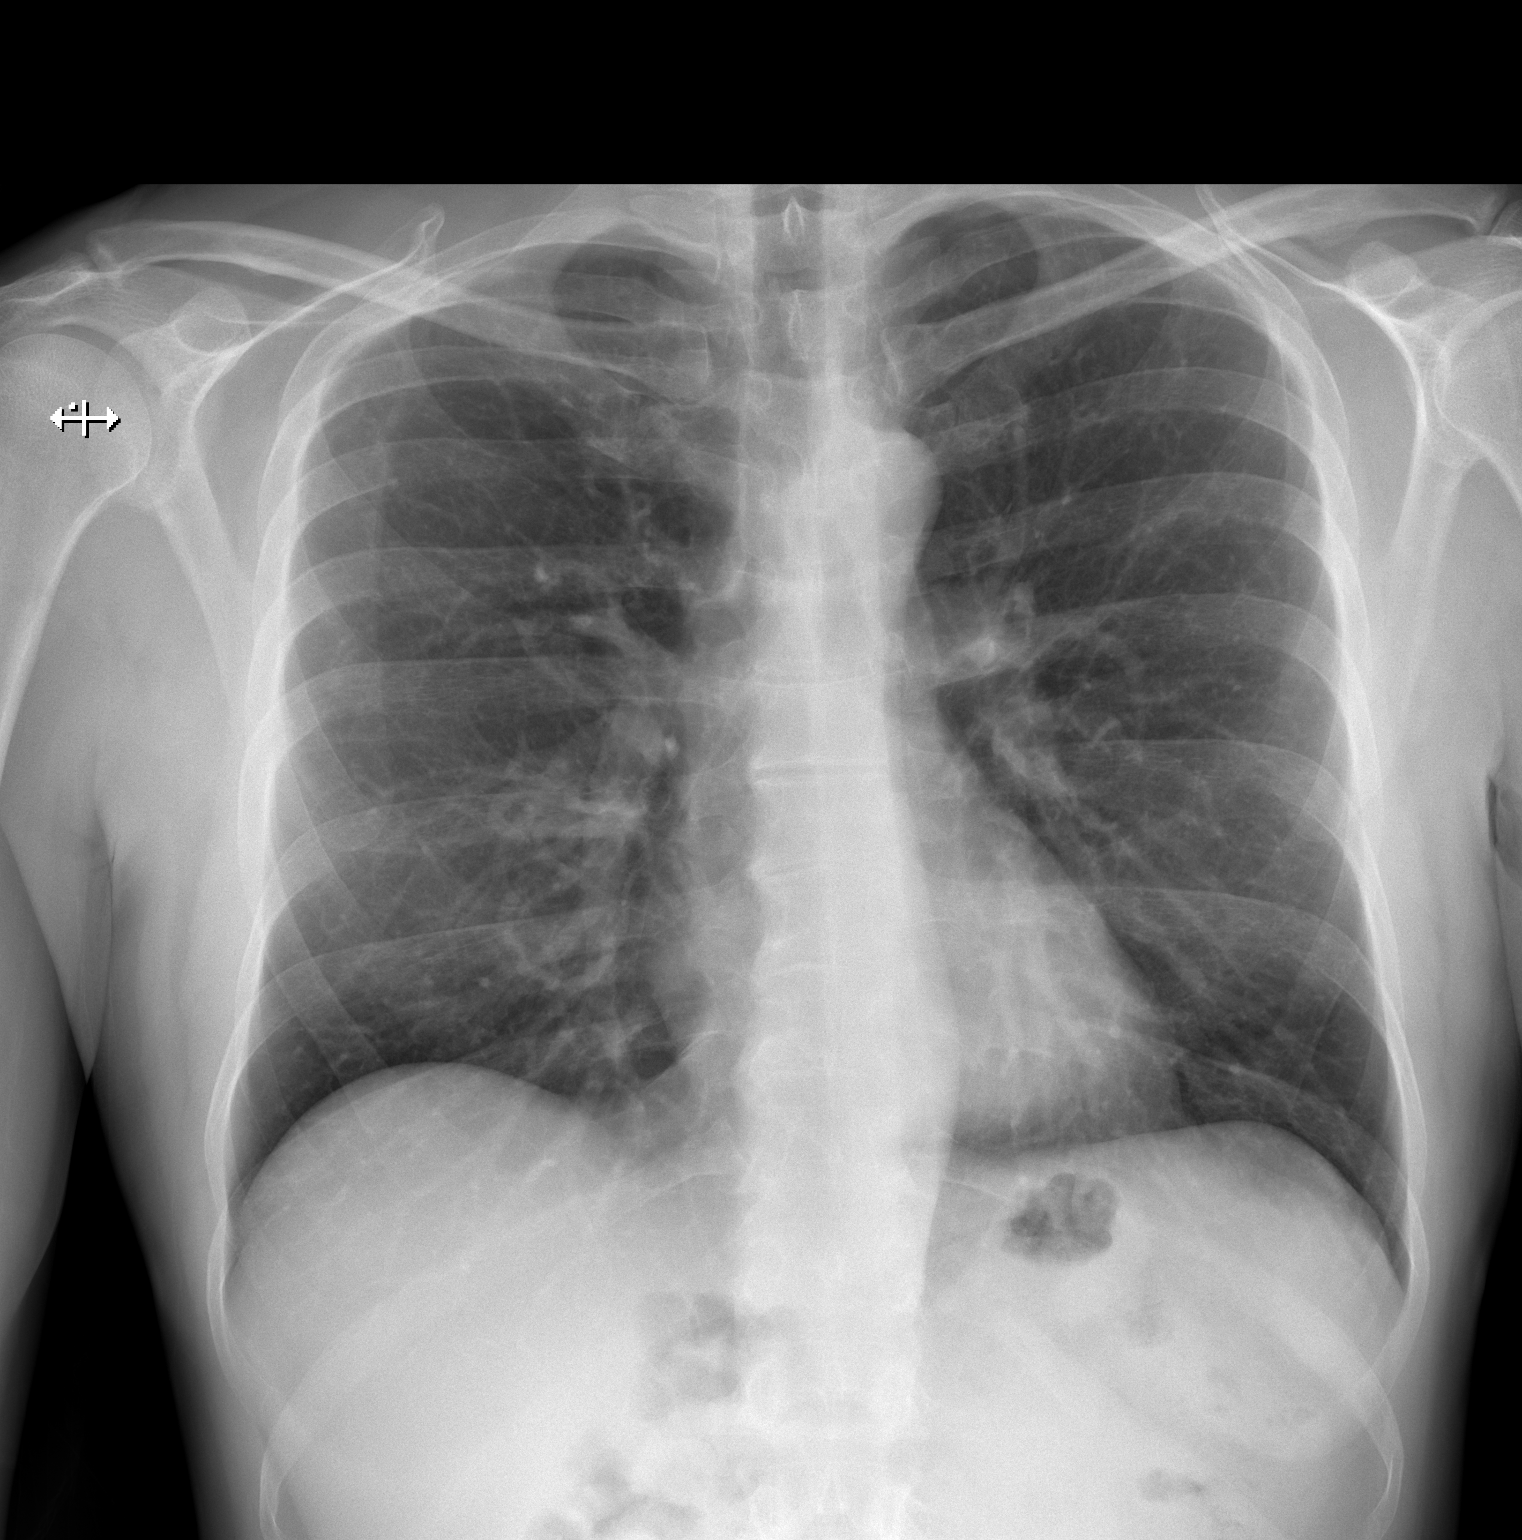

[w chest lat]
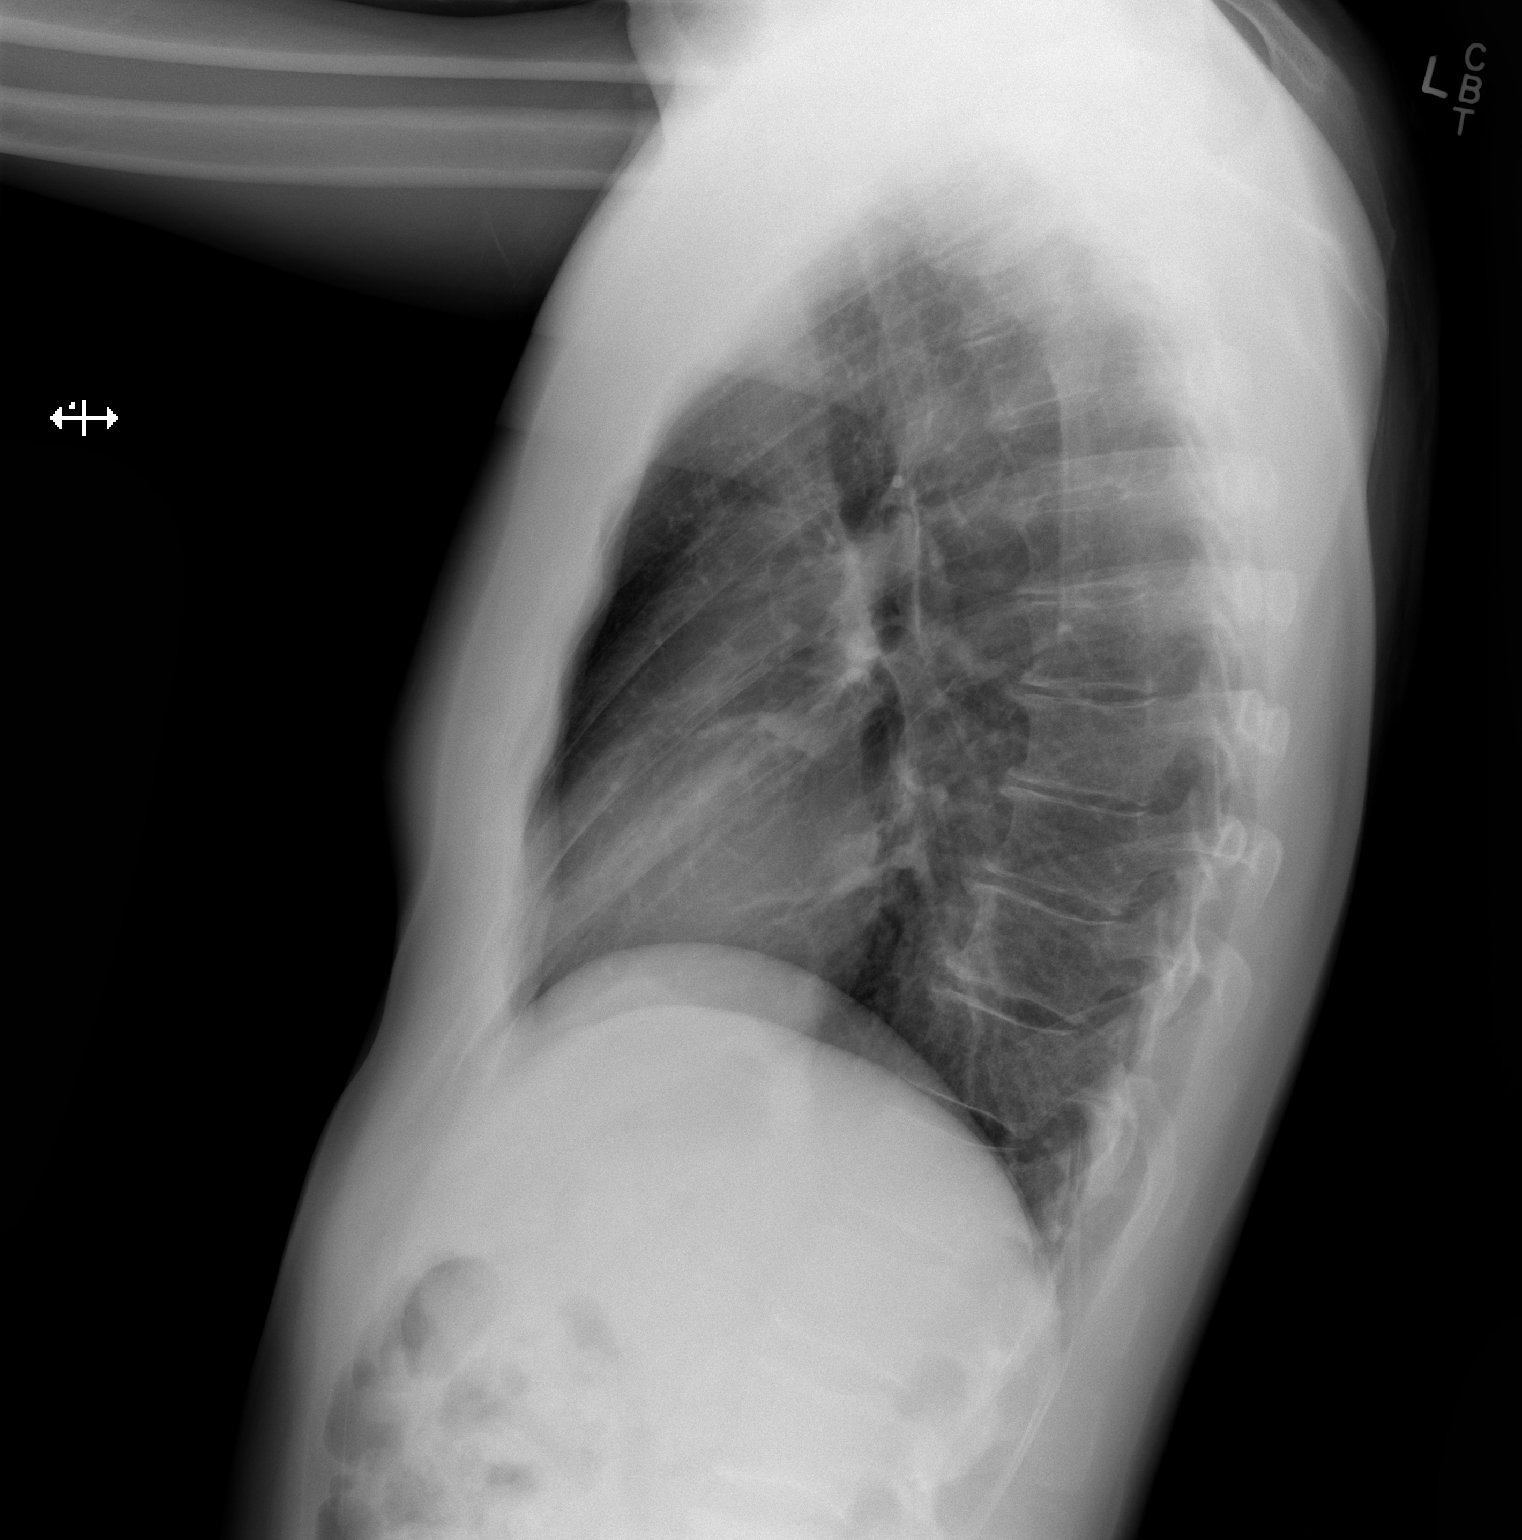

[2 of 2 positions shown; findings below may reference images not displayed]

FINDINGS: The heart size and mediastinal contours are within normal limits.
Both lungs are clear. The visualized skeletal structures are
unremarkable.
IMPRESSION: No active cardiopulmonary disease.

## 2024-01-20 ENCOUNTER — Encounter: Payer: Self-pay | Admitting: Nurse Practitioner

## 2024-03-03 ENCOUNTER — Encounter: Payer: BC Managed Care – PPO | Admitting: Nurse Practitioner

## 2024-03-09 ENCOUNTER — Ambulatory Visit: Payer: BC Managed Care – PPO | Admitting: Nurse Practitioner

## 2024-03-09 ENCOUNTER — Encounter: Payer: Self-pay | Admitting: Nurse Practitioner

## 2024-03-09 VITALS — BP 144/88 | HR 86 | Ht 70.5 in | Wt 163.0 lb

## 2024-03-09 DIAGNOSIS — D225 Melanocytic nevi of trunk: Secondary | ICD-10-CM | POA: Insufficient documentation

## 2024-03-09 DIAGNOSIS — H9313 Tinnitus, bilateral: Secondary | ICD-10-CM

## 2024-03-09 DIAGNOSIS — M722 Plantar fascial fibromatosis: Secondary | ICD-10-CM

## 2024-03-09 DIAGNOSIS — L578 Other skin changes due to chronic exposure to nonionizing radiation: Secondary | ICD-10-CM | POA: Insufficient documentation

## 2024-03-09 DIAGNOSIS — L814 Other melanin hyperpigmentation: Secondary | ICD-10-CM | POA: Insufficient documentation

## 2024-03-09 DIAGNOSIS — R030 Elevated blood-pressure reading, without diagnosis of hypertension: Secondary | ICD-10-CM

## 2024-03-09 DIAGNOSIS — E559 Vitamin D deficiency, unspecified: Secondary | ICD-10-CM | POA: Diagnosis not present

## 2024-03-09 DIAGNOSIS — E786 Lipoprotein deficiency: Secondary | ICD-10-CM | POA: Diagnosis not present

## 2024-03-09 DIAGNOSIS — D1720 Benign lipomatous neoplasm of skin and subcutaneous tissue of unspecified limb: Secondary | ICD-10-CM | POA: Insufficient documentation

## 2024-03-09 DIAGNOSIS — Z125 Encounter for screening for malignant neoplasm of prostate: Secondary | ICD-10-CM

## 2024-03-09 DIAGNOSIS — Z87898 Personal history of other specified conditions: Secondary | ICD-10-CM

## 2024-03-09 DIAGNOSIS — Z0000 Encounter for general adult medical examination without abnormal findings: Secondary | ICD-10-CM

## 2024-03-09 DIAGNOSIS — L821 Other seborrheic keratosis: Secondary | ICD-10-CM | POA: Insufficient documentation

## 2024-03-09 DIAGNOSIS — I83813 Varicose veins of bilateral lower extremities with pain: Secondary | ICD-10-CM

## 2024-03-09 DIAGNOSIS — R053 Chronic cough: Secondary | ICD-10-CM

## 2024-03-09 DIAGNOSIS — I8390 Asymptomatic varicose veins of unspecified lower extremity: Secondary | ICD-10-CM | POA: Insufficient documentation

## 2024-03-09 DIAGNOSIS — L719 Rosacea, unspecified: Secondary | ICD-10-CM | POA: Insufficient documentation

## 2024-03-09 DIAGNOSIS — D224 Melanocytic nevi of scalp and neck: Secondary | ICD-10-CM | POA: Insufficient documentation

## 2024-03-09 NOTE — Progress Notes (Unsigned)
 BP (!) 144/88   Pulse 86   Ht 5' 10.5 (1.791 m)   Wt 163 lb (73.9 kg)   SpO2 97%   BMI 23.06 kg/m    Subjective:    Patient ID: Donald Mcdonald, male    DOB: Feb 19, 1971, 53 y.o.   MRN: [id number]  HPI: History of Present Illness Donald Mcdonald is a 53 year old male who presents for an annual physical exam.  He has a history of elevated blood pressure, noted to be high last year. He does not have a blood pressure cuff at home. No chest pain, shortness of breath, or palpitations.  He has had varicose veins since his twenties, causing his legs to feel tired and achy when standing for long periods. He uses compression socks to alleviate symptoms, though they are somewhat uncomfortable. No swelling in his feet or ankles.  He mentions a new nervy sensation in the center of his bottom right foot when walking, described as being in the middle of his foot.  He has been on medication for mental health for a long time, managed by Quinten Bucker, with no changes in his mental health. He wants to eventually discontinue the medication but has not attempted to do so yet.  He experiences occasional tinnitus, described as a persistent ringing that he does not always notice.  He reports a history of a swallowing issue where he has to swallow hard sometimes, but it is not problematic and has not changed recently.  He experiences occasional palpitations but not frequently enough to cause concern.  He reports a history of coughing and feels his lung capacity is not great, though he has passed the 'blow out the candles' test. He gets out of breath after climbing four flights of stairs but does not feel it is severe.  He urinates at 4 AM and sometimes has to stand for a while to start urinating, which he feels is getting slightly worse. He also experiences some dribbling at the end of urination.  [name]- mental health     Pertinent items are noted in HPI.    Most Recent Depression Screen:      03/09/2024   10:12 AM 02/27/2023   10:29 AM 05/14/2022   10:20 AM 02/12/2022    8:26 AM 02/09/2021    9:33 AM  Depression screen PHQ 2/9  Decreased Interest 0 0 0 1 0  Down, Depressed, Hopeless 0 0 0 1 0  PHQ - 2 Score 0 0 0 2 0  Altered sleeping    0   Tired, decreased energy    1   Change in appetite    0   Feeling bad or failure about yourself     0   Trouble concentrating    1   Moving slowly or fidgety/restless    0   Suicidal thoughts    0   PHQ-9 Score    4   Difficult doing work/chores    Not difficult at all    Most Recent Anxiety Screen:     No data to display         Most Recent Falls Screen:    03/09/2024   10:12 AM 02/27/2023   10:29 AM 05/14/2022   10:20 AM 02/12/2022    8:26 AM 02/09/2021    9:33 AM  Fall Risk   Falls in the past year? 0 0 0 0 0  Number falls in past yr: 0 0 0 0 0  Injury  with Fall? 0 0 0 0 0  Risk for fall due to : No Fall Risks No Fall Risks No Fall Risks No Fall Risks No Fall Risks  Follow up Falls evaluation completed Falls evaluation completed Falls evaluation completed  Falls evaluation completed  Falls evaluation completed      Data saved with a previous flowsheet row definition    Past medical history, surgical history, medications, allergies, family history and social history reviewed with patient today and changes made to appropriate areas of the chart.  Past Medical History:  Past Medical History:  Diagnosis Date   Allergy    Seems like seasonal allergies.  Not too severe.   Anxiety    Constipation    Depression    Dr. Matha Solo   Encounter for screening for COVID-19 02/09/2021   Lentigo 03/09/2024   Nevus of neck 03/09/2024   Pain in right axilla 03/14/2022   Skin lesion 02/27/2023   Medications:  Current Outpatient Medications on File Prior to Visit  Medication Sig   Acetylcysteine (NAC) 600 MG CAPS    buPROPion (WELLBUTRIN SR) 150 MG 12 hr tablet Take 150 mg by mouth daily.   COLOSTRUM PO    fluvoxaMINE  (LUVOX) 100 MG tablet Take 100 mg by mouth at bedtime.   Glucosamine Sulfate 1000 MG TABS Take 750 mg by mouth 2 (two) times daily.   Lavender Oil 80 MG CAPS    MAGNESIUM PO    Misc Natural Products (TURMERIC CURCUMIN) CAPS    Multiple Vitamin (MULTIVITAMIN) tablet Take 1 tablet by mouth daily.   NON FORMULARY Trametes Versicolor [place] Tail supplement   Omega-3 1000 MG CAPS    Probiotic Product (PROBIOTIC DAILY) CAPS    No current facility-administered medications on file prior to visit.   Surgical History:  Past Surgical History:  Procedure Laterality Date   COLONOSCOPY  2022   repeat 5 years due to insufficient prep, Dr. Darleen Ee   Allergies:  No Known Allergies Social History:  Social History   Socioeconomic History   Marital status: Single    Spouse name: Not on file   Number of children: Not on file   Years of education: Not on file   Highest education level: Master's degree (e.g., MA, MS, MEng, MEd, MSW, MBA)  Occupational History   Not on file  Tobacco Use   Smoking status: Former    Current packs/day: 1.00    Average packs/day: 1.1 packs/day for 13.3 years (15.0 ttl pk-yrs)    Types: Cigarettes   Smokeless tobacco: Never   Tobacco comments:    Did not smoke 1+ pack per day for all 10 years.  Vaping Use   Vaping status: Never Used  Substance and Sexual Activity   Alcohol use: Not Currently    Comment: none   Drug use: Never   Sexual activity: Yes  Other Topics Concern   Not on file  Social History Narrative   Lives with girlfriend and a cat.   No children.  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  02/2023   Social Drivers of Health   Financial Resource Strain: Low Risk  (03/05/2024)   Overall Financial Resource Strain (CARDIA)    Difficulty of Paying Living Expenses: Not hard at all  Food Insecurity: No Food Insecurity (03/05/2024)   Hunger Vital Sign    Worried About Running Out of Food in the Last  Year: Never true    Ran Out  of Food in the Last Year: Never true  Transportation Needs: No Transportation Needs (03/05/2024)   PRAPARE - Administrator, Civil Service (Medical): No    Lack of Transportation (Non-Medical): No  Physical Activity: Sufficiently Active (03/05/2024)   Exercise Vital Sign    Days of Exercise per Week: 5 days    Minutes of Exercise per Session: 30 min  Stress: Stress Concern Present (03/05/2024)   [employer] of Occupational Health - Occupational Stress Questionnaire    Feeling of Stress: To some extent  Social Connections: Socially Isolated (03/05/2024)   Social Connection and Isolation Panel    Frequency of Communication with Friends and Family: Never    Frequency of Social Gatherings with Friends and Family: Never    Attends Religious Services: Never    Database administrator or Organizations: No    Attends Engineer, structural: Not on file    Marital Status: Living with partner  Intimate Partner Violence: Not At Risk (02/27/2023)   Humiliation, Afraid, Rape, and Kick questionnaire    Fear of Current or Ex-Partner: No    Emotionally Abused: No    Physically Abused: No    Sexually Abused: No   Social History   Tobacco Use  Smoking Status Former   Current packs/day: 1.00   Average packs/day: 1.1 packs/day for 13.3 years (15.0 ttl pk-yrs)   Types: Cigarettes  Smokeless Tobacco Never  Tobacco Comments   Did not smoke 1+ pack per day for all 10 years.   Social History   Substance and Sexual Activity  Alcohol Use Not Currently   Comment: none   Family History:  Family History  Problem Relation Age of Onset   Heart disease Mother 2       CABG, stents   Anxiety disorder Mother    Depression Mother    Hyperlipidemia Mother    Cancer Father        multiple myeloma   Heart disease Father 64       pacemaker   Diabetes Father    Hypertension Father    Cancer Maternal Grandfather    Diabetes Maternal Grandfather     Hearing loss Maternal Grandfather    Heart disease Maternal Grandfather    Hyperlipidemia Maternal Grandfather    Hypertension Maternal Grandfather    Cancer Paternal Grandmother    Colon cancer Paternal Grandmother    Alcohol abuse Paternal Grandmother    Anxiety disorder Paternal Grandmother    Depression Paternal Grandmother    Arthritis Maternal Grandmother    Hyperlipidemia Maternal Grandmother    Hypertension Maternal Grandmother    Hypertension Paternal Grandfather    Stroke Paternal Grandfather    Heart disease Maternal Uncle    Esophageal cancer Neg Hx    Rectal cancer Neg Hx    Stomach cancer Neg Hx        Objective:    BP (!) 144/88   Pulse 86   Ht 5' 10.5 (1.791 m)   Wt 163 lb (73.9 kg)   SpO2 97%   BMI 23.06 kg/m   Wt Readings from Last 3 Encounters:  03/09/24 163 lb (73.9 kg)  02/27/23 166 lb (75.3 kg)  10/16/22 162 lb 6.4 oz (73.7 kg)    Physical Exam Vitals and nursing note reviewed.  Constitutional:      Appearance: Normal appearance. He is normal weight.  HENT:     Head: Normocephalic and atraumatic.     Right Ear: Hearing, tympanic membrane,  ear canal and external ear normal.     Left Ear: Hearing, tympanic membrane, ear canal and external ear normal.     Nose: Nose normal.     Right Sinus: No maxillary sinus tenderness or frontal sinus tenderness.     Left Sinus: No maxillary sinus tenderness or frontal sinus tenderness.     Mouth/Throat:     Lips: Pink.     Mouth: Mucous membranes are moist.     Pharynx: Oropharynx is clear.   Eyes:     General: Lids are normal. Vision grossly intact.     Extraocular Movements: Extraocular movements intact.     Pupils: Pupils are equal, round, and reactive to light.     Funduscopic exam:    Right eye: No hemorrhage. Red reflex present.        Left eye: No hemorrhage. Red reflex present.    Visual Fields: Right eye visual fields normal and left eye visual fields normal.   Neck:     Thyroid: No  thyromegaly.     Vascular: No carotid bruit or JVD.   Cardiovascular:     Rate and Rhythm: Normal rate and regular rhythm.     Chest Wall: PMI is not displaced.     Pulses: Normal pulses.          Dorsalis pedis pulses are 2+ on the right side and 2+ on the left side.       Posterior tibial pulses are 2+ on the right side and 2+ on the left side.     Heart sounds: Normal heart sounds. No murmur heard. Pulmonary:     Effort: Pulmonary effort is normal. No respiratory distress.     Breath sounds: Normal breath sounds. No rhonchi.  Chest:  Breasts:    Breasts are symmetrical.  Abdominal:     General: Bowel sounds are normal. There is no distension or abdominal bruit.     Palpations: Abdomen is soft. There is no hepatomegaly, splenomegaly or mass.     Tenderness: There is no abdominal tenderness. There is no right CVA tenderness, left CVA tenderness, guarding or rebound.  Genitourinary:    Comments: Deferred- no reported concerns with hernia or testicular changes.   Musculoskeletal:        General: Normal range of motion.     Cervical back: Full passive range of motion without pain and neck supple. No tenderness. No spinous process tenderness or muscular tenderness.     Right lower leg: No edema.     Left lower leg: No edema.  Feet:     Right foot:     Toenail Condition: Right toenails are normal.     Left foot:     Toenail Condition: Left toenails are normal.  Lymphadenopathy:     Cervical: No cervical adenopathy.     Upper Body:     Right upper body: No supraclavicular adenopathy.     Left upper body: No supraclavicular adenopathy.   Skin:    General: Skin is warm and dry.     Capillary Refill: Capillary refill takes less than 2 seconds.     Nails: There is no clubbing.   Neurological:     General: No focal deficit present.     Mental Status: He is alert and oriented to person, place, and time.     Cranial Nerves: No cranial nerve deficit.     Sensory: Sensation is  intact. No sensory deficit.     Motor: Motor function is  intact. No weakness.     Coordination: Coordination is intact. Coordination normal.     Gait: Gait is intact. Gait normal.   Psychiatric:        Attention and Perception: Attention normal.        Mood and Affect: Mood normal.        Speech: Speech normal.        Behavior: Behavior normal. Behavior is cooperative.        Cognition and Memory: Cognition and memory normal.      Results for orders placed or performed in visit on 03/09/24  CBC with Differential/Platelet   Collection Time: 03/09/24 11:25 AM  Result Value Ref Range   WBC 5.2 3.4 - 10.8 x10E3/uL   RBC 4.97 4.14 - 5.80 x10E6/uL   Hemoglobin 15.4 13.0 - 17.7 g/dL   Hematocrit 40.1 02.7 - 51.0 %   MCV 92 79 - 97 fL   MCH 31.0 26.6 - 33.0 pg   MCHC 33.6 31.5 - 35.7 g/dL   RDW 25.3 66.4 - 40.3 %   Platelets 212 150 - 450 x10E3/uL   Neutrophils 57 Not Estab. %   Lymphs 28 Not Estab. %   Monocytes 8 Not Estab. %   Eos 5 Not Estab. %   Basos 2 Not Estab. %   Neutrophils Absolute 3.0 1.4 - 7.0 x10E3/uL   Lymphocytes Absolute 1.4 0.7 - 3.1 x10E3/uL   Monocytes Absolute 0.4 0.1 - 0.9 x10E3/uL   EOS (ABSOLUTE) 0.2 0.0 - 0.4 x10E3/uL   Basophils Absolute 0.1 0.0 - 0.2 x10E3/uL   Immature Granulocytes 0 Not Estab. %   Immature Grans (Abs) 0.0 0.0 - 0.1 x10E3/uL  CMP14+EGFR   Collection Time: 03/09/24 11:25 AM  Result Value Ref Range   Glucose 98 70 - 99 mg/dL   BUN 10 6 - 24 mg/dL   Creatinine, Ser 4.74 0.76 - 1.27 mg/dL   eGFR 259 >56 LO/VFI/4.33   BUN/Creatinine Ratio 11 9 - 20   Sodium 142 134 - 144 mmol/L   Potassium 3.9 3.5 - 5.2 mmol/L   Chloride 104 96 - 106 mmol/L   CO2 22 20 - 29 mmol/L   Calcium 9.1 8.7 - 10.2 mg/dL   Total Protein 7.0 6.0 - 8.5 g/dL   Albumin 4.5 3.8 - 4.9 g/dL   Globulin, Total 2.5 1.5 - 4.5 g/dL   Bilirubin Total 0.5 0.0 - 1.2 mg/dL   Alkaline Phosphatase 68 44 - 121 IU/L   AST 16 0 - 40 IU/L   ALT 23 0 - 44 IU/L  Lipid panel    Collection Time: 03/09/24 11:25 AM  Result Value Ref Range   Cholesterol, Total 170 100 - 199 mg/dL   Triglycerides 94 0 - 149 mg/dL   HDL 44 >29 mg/dL   VLDL Cholesterol Cal 17 5 - 40 mg/dL   LDL Chol Calc (NIH) 518 (H) 0 - 99 mg/dL   Chol/HDL Ratio 3.9 0.0 - 5.0 ratio  PSA Total (Reflex To Free)   Collection Time: 03/09/24 11:25 AM  Result Value Ref Range   Prostate Specific Ag, Serum 1.2 0.0 - 4.0 ng/mL   Reflex Criteria Comment   Hemoglobin A1c   Collection Time: 03/09/24 11:25 AM  Result Value Ref Range   Hgb A1c MFr Bld 5.3 4.8 - 5.6 %   Est. average glucose Bld gHb Est-mCnc 105 mg/dL  VITAMIN D  25 Hydroxy (Vit-D Deficiency, Fractures)   Collection Time: 03/09/24 11:25 AM  Result Value  Ref Range   Vit D, 25-Hydroxy 32.0 30.0 - 100.0 ng/mL      Assessment & Plan:   Problem List Items Addressed This Visit     Screening for prostate cancer   We discussed current guidelines that do not recommend the practice of manual prostate examination in males due to no benefit found. PSA levels consistently normal with the last several checks. I have encouraged him to reach out if he begins to have symptoms of BPH. Will check PSA levels today.       Relevant Orders   PSA Total (Reflex To Free) (Completed)   Chronic cough   He mentions previous evaluations with spirometry in the office without abnormal findings, but dyspnea with exertion is present. We discussed possible referral to pulmonology for more formal testing of lung function. He will consider this.       Relevant Orders   CBC with Differential/Platelet (Completed)   Routine general medical examination at a health care facility - Primary   CPE completed today. Review of HM activities and recommendations discussed and provided on AVS. Anticipatory guidance, diet, and exercise recommendations provided. Medications, allergies, and hx reviewed and updated as necessary. Orders placed as listed below.  Plan: - Labs ordered. Will  make changes as necessary based on results.  - I will review these results and send recommendations via MyChart or a telephone call.  - F/U with CPE in 1 year or sooner for acute/chronic health needs as directed.        Relevant Orders   CBC with Differential/Platelet (Completed)   CMP14+EGFR (Completed)   Lipid panel (Completed)   PSA Total (Reflex To Free) (Completed)   Hemoglobin A1c (Completed)   VITAMIN D  25 Hydroxy (Vit-D Deficiency, Fractures) (Completed)   Vitamin D  deficiency   Previous deficiency. Labs pending for evaluation.       Relevant Orders   VITAMIN D  25 Hydroxy (Vit-D Deficiency, Fractures) (Completed)   Low HDL (under 40)   Not currently on statin therapy. Chronically low HDL with normal LDL levels seen. Recommend continued dietary management with high fiber and low fat dietary options as well as routine exercise of at least 150 minutes a week.       Relevant Orders   CMP14+EGFR (Completed)   Lipid panel (Completed)   Hemoglobin A1c (Completed)   Varicose vein of leg   Long-standing varicose veins causing occasional leg fatigue and achiness, especially when standing for prolonged periods. Compression stockings provide some relief but are uncomfortable. No significant swelling noted. Discussed potential for worsening and the option of vascular surgery if symptoms become more bothersome. Vascular surgery can offer procedures to alleviate symptoms if compression stockings are insufficient. - Continue using compression stockings as needed. - Monitor for worsening symptoms or development of swelling. - Consider referral to vascular surgery if symptoms worsen.      Elevated blood pressure reading without diagnosis of hypertension   Blood pressure was elevated at 138/90 initially and 144/88 upon recheck, suggesting possible white coat hypertension. No immediate concern for stroke or severe complications. Emphasized maintaining blood pressure around 120/80. Home  monitoring recommended to differentiate between office-induced and true hypertension. Intervention may be necessary if home readings consistently show 130/80 or higher to prevent long-term complications. - Recommend purchasing a home blood pressure cuff, preferably the cuff type for accuracy. - Instruct on proper technique for home blood pressure monitoring, including sitting calmly and avoiding caffeine before measurement. - Send a follow-up message in a few  weeks to remind about sending blood pressure readings. - Consider intervention if home readings consistently show 130/80 or higher.      Relevant Orders   CBC with Differential/Platelet (Completed)   CMP14+EGFR (Completed)   Hemoglobin A1c (Completed)   History of urinary hesitancy   Intermittent urinary symptoms including difficulty starting urination, possibly indicative of BPH. A previous PSA was high but the most recent years this has been normal. Discussed potential for BPH and the role of PSA testing in monitoring. Medication like Flomax  can help alleviate symptoms by relaxing the ureters if necessary. - Check PSA levels with current labs to monitor for BPH. - Provide information on BPH for patient education. - Consider medication like Flomax  if symptoms become more bothersome.      Relevant Orders   PSA Total (Reflex To Free) (Completed)   Plantar fasciitis   New nervy sensation in the center of the bottom right foot, possibly related to plantar fasciitis or lack of arch support. Symptoms occur while walking, not standing. Emphasized the importance of good arch support to prevent further issues. Recommended trying Dr. Randon Butters arch support inserts as a first step before considering further evaluation. - Recommend trying Dr. Randon Butters arch support inserts, available at [facility]. - Consider referral to podiatry if symptoms persist or worsen.      Tinnitus of both ears   Persistent ringing in the ears, not always noticeable. No  significant changes or concerns noted. No immediate intervention required.         Follow up plan: NEXT PREVENTATIVE PHYSICAL DUE IN 1 YEAR. Return in about 1 year (around 03/09/2025) for CPE. Send [facility] message for 2 weeks from now for BP readings.   LABORATORY TESTING:  Health maintenance labs ordered today, if applicable.    PATIENT COUNSELING:   For all adult patients, I recommend A well balanced diet low in saturated fats, cholesterol, and moderation in carbohydrates.  This can be as simple as monitoring portion sizes and cutting back on sugary beverages such as soda and juice to start with.    Daily water consumption of at least 64 ounces.  Physical activity at least 180 minutes per week, if just starting out.  This can be as simple as taking the stairs instead of the elevator and walking 2-3 laps around the office  purposefully every day.   STD protection, partner selection, and regular testing if high risk.  Limited consumption of alcoholic beverages if alcohol is consumed. For men, I recommend no more than 14 alcoholic beverages per week, spread out throughout the week (max 2 per day). Avoid binge drinking or consuming large quantities of alcohol in one setting.  Please let me know if you feel you may need help with reduction or quitting alcohol consumption.   Avoidance of nicotine, if used. Please let me know if you feel you may need help with reduction or quitting nicotine use.   Daily mental health attention. This can be in the form of 5 minute daily meditation, prayer, journaling, yoga, reflection, etc.  Purposeful attention to your emotions and mental state can significantly improve your overall wellbeing and Health.  Please know that I am here to help you with all of your health care goals and am happy to work with you to find a solution that works best for you.  The greatest advice I have received with any changes in life are to take it one step at a time, that  even means if all you can  focus on is the next 60 seconds, then do that and celebrate your victories.  With any changes in life, you will have set backs, and that is OK. The important thing to remember is, if you have a set back, it is not a failure, it is an opportunity to try again!  Health Maintenance Recommendations Screening Testing Mammogram Every 1 -2 years based on history and risk factors Starting at age 20 Pap Smear Ages 21-39 every 3 years Ages 31-65 every 5 years with HPV testing More frequent testing may be required based on results and history Colon Cancer Screening Every 1-10 years based on test performed, risk factors, and history Starting at age 19 Bone Density Screening Every 2-10 years based on history Starting at age 79 for women Recommendations for men differ based on medication usage, history, and risk factors AAA Screening One time ultrasound Men 90-44 years old who have every smoked Lung Cancer Screening Low Dose Lung CT every 12 months Age 94-80 years with a 30 pack-year smoking history who still smoke or who have quit within the last 15 years   Screening Labs Routine  Labs: Complete Blood Count (CBC), Complete Metabolic Panel (CMP), Cholesterol (Lipid Panel) Every 6-12 months based on history and medications May be recommended more frequently based on current conditions or previous results Hemoglobin A1c Lab Every 3-12 months based on history and previous results Starting at age 45 or earlier with diagnosis of diabetes, high cholesterol, BMI >26, and/or risk factors Frequent monitoring for patients with diabetes to ensure blood sugar control Thyroid Panel (TSH) Every 6 months based on history, symptoms, and risk factors May be repeated more often if on medication HIV One time testing for all patients 1 and older May be repeated more frequently for patients with increased risk factors or exposure Hepatitis C One time testing for all patients 73 and  older May be repeated more frequently for patients with increased risk factors or exposure Gonorrhea, Chlamydia Every 12 months for all sexually active persons 13-24 years Additional monitoring may be recommended for those who are considered high risk or who have symptoms Every 12 months for any woman on birth control, regardless of sexual activity PSA Men 63-73 years old with risk factors Additional screening may be recommended from age 67-69 based on risk factors, symptoms, and history  Vaccine Recommendations Tetanus Booster All adults every 10 years Flu Vaccine All patients 6 months and older every year COVID Vaccine All patients 12 years and older Initial dosing with booster May recommend additional booster based on age and health history HPV Vaccine 2 doses all patients age 78-26 Dosing may be considered for patients over 26 Shingles Vaccine (Shingrix) 2 doses all adults 55 years and older Pneumonia (Pneumovax 39) All adults 65 years and older May recommend earlier dosing based on health history One year apart from Prevnar 23 Pneumonia (Prevnar 60) All adults 65 years and older Dosed 1 year after Pneumovax 23 Pneumonia (Prevnar 20) One time alternative to the two dosing of 13 and 23 For all adults with initial dose of 23, 20 is recommended 1 year later For all adults with initial dose of 13, 23 is still recommended as second option 1 year later  Additional Screening, Testing, and Vaccinations may be recommended on an individualized basis based on family history, health history, risk factors, and/or exposure.

## 2024-03-09 NOTE — Patient Instructions (Addendum)
 I recommend checking your blood pressure once a day for the next couple of weeks and sending me a message to let me know where it is. If it is staying higher than 120/80 on average, we can consider a low dose of medication to help get this down for better control.   If the varicose veins seem to be getting worse, please reach out and let me know. We can send a referral to the vascular specialist for evaluation. In the meantime continue to wear the compression stockings.   If you have any concerns with cough or breathing and would like to see the pulmonologist, please let me know and I am happy to send that referral for you.    For all adult patients, I recommend A well balanced diet low in saturated fats, cholesterol, and moderation in carbohydrates.   This can be as simple as monitoring portion sizes and cutting back on sugary beverages such as soda and juice to start with.    Daily water consumption of at least 64 ounces.  Physical activity at least 180 minutes per week, if just starting out.   This can be as simple as taking the stairs instead of the elevator and walking 2-3 laps around the office  purposefully every day.   STD protection, partner selection, and regular testing if high risk.  Limited consumption of alcoholic beverages if alcohol is consumed.  For women, I recommend no more than 7 alcoholic beverages per week, spread out throughout the week.  Avoid binge drinking or consuming large quantities of alcohol in one setting.   Please let me know if you feel you may need help with reduction or quitting alcohol consumption.   Avoidance of nicotine, if used.  Please let me know if you feel you may need help with reduction or quitting nicotine use.   Daily mental health attention.  This can be in the form of 5 minute daily meditation, prayer, journaling, yoga, reflection, etc.   Purposeful attention to your emotions and mental state can significantly improve your overall  wellbeing  and  Health.  Please know that I am here to help you with all of your health care goals and am happy to work with you to find a solution that works best for you.  The greatest advice I have received with any changes in life are to take it one step at a time, that even means if all you can focus on is the next 60 seconds, then do that and celebrate your victories.  With any changes in life, you will have set backs, and that is OK. The important thing to remember is, if you have a set back, it is not a failure, it is an opportunity to try again!  Health Maintenance Recommendations Screening Testing Mammogram Every 1 -2 years based on history and risk factors Starting at age 82 Pap Smear Ages 21-39 every 3 years Ages 60-65 every 5 years with HPV testing More frequent testing may be required based on results and history Colon Cancer Screening Every 1-10 years based on test performed, risk factors, and history Starting at age 21 Bone Density Screening Every 2-10 years based on history Starting at age 38 for women Recommendations for men differ based on medication usage, history, and risk factors AAA Screening One time ultrasound Men 51-57 years old who have every smoked Lung Cancer Screening Low Dose Lung CT every 12 months Age 51-80 years with a 30 pack-year smoking history who still  smoke or who have quit within the last 15 years  Screening Labs Routine  Labs: Complete Blood Count (CBC), Complete Metabolic Panel (CMP), Cholesterol (Lipid Panel) Every 6-12 months based on history and medications May be recommended more frequently based on current conditions or previous results Hemoglobin A1c Lab Every 3-12 months based on history and previous results Starting at age 64 or earlier with diagnosis of diabetes, high cholesterol, BMI >26, and/or risk factors Frequent monitoring for patients with diabetes to ensure blood sugar control Thyroid Panel (TSH w/ T3 & T4) Every 6  months based on history, symptoms, and risk factors May be repeated more often if on medication HIV One time testing for all patients 106 and older May be repeated more frequently for patients with increased risk factors or exposure Hepatitis C One time testing for all patients 52 and older May be repeated more frequently for patients with increased risk factors or exposure Gonorrhea, Chlamydia Every 12 months for all sexually active persons 13-24 years Additional monitoring may be recommended for those who are considered high risk or who have symptoms PSA Men 62-30 years old with risk factors Additional screening may be recommended from age 33-69 based on risk factors, symptoms, and history  Vaccine Recommendations Tetanus Booster All adults every 10 years Flu Vaccine All patients 6 months and older every year COVID Vaccine All patients 12 years and older Initial dosing with booster May recommend additional booster based on age and health history HPV Vaccine 2 doses all patients age 20-26 Dosing may be considered for patients over 26 Shingles Vaccine (Shingrix) 2 doses all adults 55 years and older Pneumonia (Pneumovax 23) All adults 65 years and older May recommend earlier dosing based on health history Pneumonia (Prevnar 83) All adults 65 years and older Dosed 1 year after Pneumovax 23  Additional Screening, Testing, and Vaccinations may be recommended on an individualized basis based on family history, health history, risk factors, and/or exposure.

## 2024-03-10 LAB — PSA TOTAL (REFLEX TO FREE): Prostate Specific Ag, Serum: 1.2 ng/mL (ref 0.0–4.0)

## 2024-03-10 LAB — LIPID PANEL
Chol/HDL Ratio: 3.9 ratio (ref 0.0–5.0)
Cholesterol, Total: 170 mg/dL (ref 100–199)
HDL: 44 mg/dL (ref >39)
LDL Chol Calc (NIH): 109 mg/dL — ABNORMAL HIGH (ref 0–99)
Triglycerides: 94 mg/dL (ref 0–149)
VLDL Cholesterol Cal: 17 mg/dL (ref 5–40)

## 2024-03-10 LAB — CMP14+EGFR
ALT: 23 IU/L (ref 0–44)
AST: 16 IU/L (ref 0–40)
Albumin: 4.5 g/dL (ref 3.8–4.9)
Alkaline Phosphatase: 68 IU/L (ref 44–121)
BUN/Creatinine Ratio: 11 (ref 9–20)
BUN: 10 mg/dL (ref 6–24)
Bilirubin Total: 0.5 mg/dL (ref 0.0–1.2)
CO2: 22 mmol/L (ref 20–29)
Calcium: 9.1 mg/dL (ref 8.7–10.2)
Chloride: 104 mmol/L (ref 96–106)
Creatinine, Ser: 0.91 mg/dL (ref 0.76–1.27)
Globulin, Total: 2.5 g/dL (ref 1.5–4.5)
Glucose: 98 mg/dL (ref 70–99)
Potassium: 3.9 mmol/L (ref 3.5–5.2)
Sodium: 142 mmol/L (ref 134–144)
Total Protein: 7 g/dL (ref 6.0–8.5)
eGFR: 101 mL/min/{1.73_m2} (ref >59)

## 2024-03-10 LAB — CBC WITH DIFFERENTIAL/PLATELET
Basophils Absolute: 0.1 10*3/uL (ref 0.0–0.2)
Basos: 2 %
EOS (ABSOLUTE): 0.2 10*3/uL (ref 0.0–0.4)
Eos: 5 %
Hematocrit: 45.9 % (ref 37.5–51.0)
Hemoglobin: 15.4 g/dL (ref 13.0–17.7)
Immature Grans (Abs): 0 10*3/uL (ref 0.0–0.1)
Immature Granulocytes: 0 %
Lymphocytes Absolute: 1.4 10*3/uL (ref 0.7–3.1)
Lymphs: 28 %
MCH: 31 pg (ref 26.6–33.0)
MCHC: 33.6 g/dL (ref 31.5–35.7)
MCV: 92 fL (ref 79–97)
Monocytes Absolute: 0.4 10*3/uL (ref 0.1–0.9)
Monocytes: 8 %
Neutrophils Absolute: 3 10*3/uL (ref 1.4–7.0)
Neutrophils: 57 %
Platelets: 212 10*3/uL (ref 150–450)
RBC: 4.97 x10E6/uL (ref 4.14–5.80)
RDW: 12.8 % (ref 11.6–15.4)
WBC: 5.2 10*3/uL (ref 3.4–10.8)

## 2024-03-10 LAB — HEMOGLOBIN A1C
Est. average glucose Bld gHb Est-mCnc: 105 mg/dL
Hgb A1c MFr Bld: 5.3 % (ref 4.8–5.6)

## 2024-03-10 LAB — VITAMIN D 25 HYDROXY (VIT D DEFICIENCY, FRACTURES): Vit D, 25-Hydroxy: 32 ng/mL (ref 30.0–100.0)

## 2024-03-11 ENCOUNTER — Encounter: Payer: Self-pay | Admitting: Nurse Practitioner

## 2024-03-11 ENCOUNTER — Ambulatory Visit: Payer: Self-pay | Admitting: Nurse Practitioner

## 2024-03-11 DIAGNOSIS — H9313 Tinnitus, bilateral: Secondary | ICD-10-CM | POA: Insufficient documentation

## 2024-03-11 DIAGNOSIS — M722 Plantar fascial fibromatosis: Secondary | ICD-10-CM | POA: Insufficient documentation

## 2024-03-11 NOTE — Assessment & Plan Note (Addendum)
 We discussed current guidelines that do not recommend the practice of manual prostate examination in males due to no benefit found. PSA levels consistently normal with the last several checks. I have encouraged him to reach out if he begins to have symptoms of BPH. Will check PSA levels today.

## 2024-03-11 NOTE — Assessment & Plan Note (Signed)
 Intermittent urinary symptoms including difficulty starting urination, possibly indicative of BPH. A previous PSA was high but the most recent years this has been normal. Discussed potential for BPH and the role of PSA testing in monitoring. Medication like Flomax  can help alleviate symptoms by relaxing the ureters if necessary. - Check PSA levels with current labs to monitor for BPH. - Provide information on BPH for patient education. - Consider medication like Flomax  if symptoms become more bothersome.

## 2024-03-11 NOTE — Assessment & Plan Note (Signed)
 Long-standing varicose veins causing occasional leg fatigue and achiness, especially when standing for prolonged periods. Compression stockings provide some relief but are uncomfortable. No significant swelling noted. Discussed potential for worsening and the option of vascular surgery if symptoms become more bothersome. Vascular surgery can offer procedures to alleviate symptoms if compression stockings are insufficient. - Continue using compression stockings as needed. - Monitor for worsening symptoms or development of swelling. - Consider referral to vascular surgery if symptoms worsen.

## 2024-03-11 NOTE — Assessment & Plan Note (Signed)
 Blood pressure was elevated at 138/90 initially and 144/88 upon recheck, suggesting possible white coat hypertension. No immediate concern for stroke or severe complications. Emphasized maintaining blood pressure around 120/80. Home monitoring recommended to differentiate between office-induced and true hypertension. Intervention may be necessary if home readings consistently show 130/80 or higher to prevent long-term complications. - Recommend purchasing a home blood pressure cuff, preferably the cuff type for accuracy. - Instruct on proper technique for home blood pressure monitoring, including sitting calmly and avoiding caffeine before measurement. - Send a follow-up message in a few weeks to remind about sending blood pressure readings. - Consider intervention if home readings consistently show 130/80 or higher.

## 2024-03-11 NOTE — Assessment & Plan Note (Signed)
 New nervy sensation in the center of the bottom right foot, possibly related to plantar fasciitis or lack of arch support. Symptoms occur while walking, not standing. Emphasized the importance of good arch support to prevent further issues. Recommended trying Dr. Randon Butters arch support inserts as a first step before considering further evaluation. - Recommend trying Dr. Randon Butters arch support inserts, available at [facility]. - Consider referral to podiatry if symptoms persist or worsen.

## 2024-03-11 NOTE — Assessment & Plan Note (Signed)
 Previous deficiency. Labs pending for evaluation.

## 2024-03-11 NOTE — Assessment & Plan Note (Signed)
 He mentions previous evaluations with spirometry in the office without abnormal findings, but dyspnea with exertion is present. We discussed possible referral to pulmonology for more formal testing of lung function. He will consider this.

## 2024-03-11 NOTE — Assessment & Plan Note (Signed)

## 2024-03-11 NOTE — Assessment & Plan Note (Signed)
 Not currently on statin therapy. Chronically low HDL with normal LDL levels seen. Recommend continued dietary management with high fiber and low fat dietary options as well as routine exercise of at least 150 minutes a week.

## 2024-03-11 NOTE — Assessment & Plan Note (Signed)
 Persistent ringing in the ears, not always noticeable. No significant changes or concerns noted. No immediate intervention required.

## 2024-03-19 ENCOUNTER — Encounter: Payer: Self-pay | Admitting: Nurse Practitioner

## 2024-03-19 ENCOUNTER — Other Ambulatory Visit: Payer: Self-pay

## 2024-03-30 NOTE — Telephone Encounter (Signed)
 Appt made

## 2024-03-30 NOTE — Telephone Encounter (Signed)
Left message to schedule appt

## 2024-03-31 ENCOUNTER — Ambulatory Visit: Admitting: Nurse Practitioner

## 2024-03-31 ENCOUNTER — Encounter: Payer: Self-pay | Admitting: Nurse Practitioner

## 2024-03-31 VITALS — BP 134/86 | HR 78 | Wt 164.2 lb

## 2024-03-31 DIAGNOSIS — R0683 Snoring: Secondary | ICD-10-CM

## 2024-03-31 DIAGNOSIS — Z77011 Contact with and (suspected) exposure to lead: Secondary | ICD-10-CM | POA: Diagnosis not present

## 2024-03-31 DIAGNOSIS — R030 Elevated blood-pressure reading, without diagnosis of hypertension: Secondary | ICD-10-CM

## 2024-03-31 NOTE — Patient Instructions (Signed)
 What is Elevated Blood Pressure? Blood pressure (BP) measures the force of blood against your artery walls. Elevated BP means your readings are higher than normal but not yet in the high blood pressure (hypertension) range.  Category Systolic (Top #) Diastolic (Bottom #) Normal  < 879  and  < 80 Elevated 120-129 and  < 80 Hypertension >= 130  or  >= 80 Low BP < 90  or  < 60  Why is Elevated BP a Concern? If left untreated, elevated BP can lead to: * Heart disease * Stroke * Kidney damage * Eye problems * Memory issues  Symptoms to Watch For High BP (Hypertension)   Low BP (Hypotension) Often no symptoms (silent killer)  Dizziness or lightheadedness Headaches     Fainting or fatigue Blurred vision     Blurred vision Chest pain     Nausea or cold/clammy skin Nosebleeds (in severe cases)  Trouble concentrating  Natural Ways to Lower Blood Pressure Eat a heart-healthy diet: * Choose fruits, vegetables, whole grains * Reduce salt (sodium) * Limit red meat and processed foods  Exercise regularly: * Aim for 30 minutes of moderate activity (like walking) 5 days a week  Limit alcohol & quit smoking: * No more than 1 drink/day (women) or 2/day (men)  Manage stress: * Try deep breathing, meditation, or yoga  Maintain a healthy weight: * Even a small weight loss can make a big difference  Stay Hydrated: *Drink at least 80-100 ounces of water every day * Avoid drinks with caffeine and sugar  Medical Treatment Options If lifestyle changes aren't enough, your doctor may recommend medications, such as: * Diuretics: Help remove excess fluid * ACE inhibitors/ARBs: Help relax blood vessels * Beta-blockers: Slow heart rate and reduce workload * Calcium channel blockers: Relax blood vessels and lower heart rate  Note: Always follow your provider's instructions when starting or stopping medications.  Possible Side Effects of High Blood Pressure (Untreated) * Stroke * Heart  attack * Heart failure * Kidney disease * Vision loss * Cognitive decline  When to Seek Medical Help * BP higher than 180/120 mmHg with symptoms (chest pain, shortness of breath, confusion, etc.) * Signs of low BP causing fainting or dizziness * Sudden or severe symptoms  Your Target Blood Pressure Goal Less than or equal to 110/70 to 120/80 mmHg   Keep Track of Your BP * Take your BP at home regularly (1-2 times a week is usually enough) * Record your readings and bring them to your appointments * Use a well-calibrated home monitor (upper arm is most accurate). You can bring this to your appointment with you to have us  make sure it is accurate.    How to Check Your Blood Pressure at Home Checking your blood pressure (BP) at home helps you and your healthcare provider keep track of your heart health. It's simple, but there are a few important steps to follow for accurate results.  What You'll Need ? An automatic (digital) blood pressure monitor with a cuff that fits your upper arm. (Wrist monitors often give false high readings)  Before You Check Your BP ?? Time it right: * Measure at the same time each day (e.g., morning and evening) * Don't measure right after waking up or after exercise  ? Avoid these for 30 minutes before checking: * Smoking * Caffeine (coffee, tea, energy drinks) * Exercise * Heavy meals  ???? Rest for 5 minutes before measuring * Sit calmly and breathe normally  How to Take Your BP Step-by-Step Sit in the right position:  * Sit in a chair with your back straight and supported * Keep your feet flat on the floor (don't cross your legs) * Rest your arm on a table so the cuff is at heart level  Apply the cuff: * Wrap the cuff around your bare upper arm * The cuff should be snug, but not tight * Place the bottom edge of the cuff about 1 inch above your elbow  Start the machine: * Press the "Start" button * Stay still and silent while it inflates  and reads your BP * Avoid talking, moving, eating or drinking while checking  Record your results: * Write down the systolic (top number) and diastolic (bottom number) * Note the time and date * If your monitor shows pulse (heart rate), you can record that too  Repeat (if needed due to high or low reading): * Wait 1-2 minutes and take a second reading * Take the average of 2-3 readings if instructed by your provider  Tips for Accuracy * Always use the same arm for readings * Don't talk, move, or tense your arm during the reading * Keep a log or journal of your BP readings * Bring your monitor to appointments to make sure it's accurate  When to Call for Help ?? If your BP is: * Above 180/120 AND you feel chest pain, shortness of breath, confusion, or dizziness -- Call 911  ?? If your BP is very low and you feel faint or lightheaded -- Contact your doctor

## 2024-03-31 NOTE — Progress Notes (Addendum)
 Catheline Doing, DNP, AGNP-c [facility]  [address] [place], [place] [address] [phone]  ESTABLISHED PATIENT- Chronic Health and/or Follow-Up Visit  Blood pressure 134/86, pulse 78, weight 164 lb 3.2 oz (74.5 kg).    History of Present Illness Donald Mcdonald is a 53 year old male with hypertension who presents with concerns about elevated blood pressure readings.  We briefly reviewed records of his blood pressures in the office since May 29, 2016, with stable readings less than 120/80 mmHg until recently. Since June 2024, our in office readings have shown an increase into the 130's-140's/80's-90's range. He has not changed any medications in the past year and reports no significant stressors comparable to his father's death in May 29, 2022.  He has a strong family history of hypertension, including his father, maternal grandfather, maternal grandmother, and paternal grandfather. He takes fluvoxamine, which he increased by 50 mg in 2022-05-29, he ha also been stable on Wellbutrin for many years. He recently started taking ashwagandha for stress and inflammation.   He experiences jaw clenching at night and wakes up with his tongue pressed against the roof of his mouth, leading to a dry mouth sensation. No episodes of waking up gasping for air. His partner reports that he snores, but not loudly.  He is trying to reduce sodium intake and increase water consumption. He drinks a fair amount of tea and is working on increasing his water intake. He has not noticed any significant changes in his sleep quality over the past year.  He expresses concern about potential lead exposure due to his hobbies, which include soldering and glass blowing. He also mentions receiving a notice from the water company about unknown water line materials, but he uses water filters at home.  All ROS negative with exception of what is listed above.   PHYSICAL EXAM Physical Exam Vitals and nursing note reviewed.   Constitutional:      General: He is not in acute distress.    Appearance: Normal appearance.  Eyes:     General: No scleral icterus.    Conjunctiva/sclera: Conjunctivae normal.  Neck:     Vascular: No carotid bruit.  Cardiovascular:     Rate and Rhythm: Normal rate and regular rhythm.     Pulses: Normal pulses.     Heart sounds: Normal heart sounds.  Pulmonary:     Effort: Pulmonary effort is normal.     Breath sounds: Normal breath sounds.  Abdominal:     General: Bowel sounds are normal. There is no distension.     Palpations: Abdomen is soft.     Tenderness: There is no abdominal tenderness.  Musculoskeletal:     Right lower leg: No edema.     Left lower leg: No edema.  Skin:    General: Skin is warm and dry.     Capillary Refill: Capillary refill takes less than 2 seconds.  Neurological:     General: No focal deficit present.     Mental Status: He is alert and oriented to person, place, and time.     Motor: No weakness.  Psychiatric:        Mood and Affect: Mood normal.      PLAN Problem List Items Addressed This Visit     Elevated blood pressure reading without diagnosis of hypertension   Blood pressure readings indicate stage 1 hypertension with consistently high diastolic pressure. No specific cause identified for the recent increase. Possible contributing factors include stress, family history, and potential sleep disturbances. Labs indicate  good kidney and liver function, ruling out organ-related causes. He is concerned about long-term implications and medication side effects. Discussed potential benefits of hydrochlorothiazide and reducing bupropion dosage to assess impact on blood pressure. - Monitor blood pressure at home regularly. - Consider reducing bupropion dosage from 150 mg to 100 mg and monitor effects on blood pressure. - Order home sleep study to evaluate for possible sleep apnea. - Consider starting hydrochlorothiazide 12.5 mg daily if blood  pressure remains elevated after medication adjustment. - Discussed potential side effects of hydrochlorothiazide, including increased urination.      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 Lead exposure contributing to hypertension is considered unlikely given his history and current symptoms. - Consider checking lead levels during next blood draw.      Other Visit Diagnoses       Habitual snoring    -  Primary   Relevant Orders   Home sleep test     Follow-up Advised to follow up on blood pressure management and potential sleep apnea diagnosis. - Send MyChart message in a few weeks with updated blood pressure readings. - Follow up after home sleep study results are available.  Return in about 6 months (around 10/01/2024), or if symptoms worsen or fail to improve, for Med Management 30.  Catheline Doing, DNP, AGNP-c

## 2024-04-07 ENCOUNTER — Encounter: Payer: Self-pay | Admitting: Nurse Practitioner

## 2024-04-20 ENCOUNTER — Encounter: Payer: Self-pay | Admitting: Nurse Practitioner

## 2024-04-20 DIAGNOSIS — Z77011 Contact with and (suspected) exposure to lead: Secondary | ICD-10-CM | POA: Insufficient documentation

## 2024-04-20 NOTE — Assessment & Plan Note (Signed)
 Blood pressure readings indicate stage 1 hypertension with consistently high diastolic pressure. No specific cause identified for the recent increase. Possible contributing factors include stress, family history, and potential sleep disturbances. Labs indicate good kidney and liver function, ruling out organ-related causes. He is concerned about long-term implications and medication side effects. Discussed potential benefits of hydrochlorothiazide and reducing bupropion dosage to assess impact on blood pressure. - Monitor blood pressure at home regularly. - Consider reducing bupropion dosage from 150 mg to 100 mg and monitor effects on blood pressure. - Order home sleep study to evaluate for possible sleep apnea. - Consider starting hydrochlorothiazide 12.5 mg daily if blood pressure remains elevated after medication adjustment. - Discussed potential side effects of hydrochlorothiazide, including increased urination.

## 2024-04-20 NOTE — Assessment & Plan Note (Signed)
 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 Lead exposure contributing to hypertension is considered unlikely given his history and current symptoms. - Consider checking lead levels during next blood draw.

## 2024-04-24 ENCOUNTER — Encounter: Payer: Self-pay | Admitting: Nurse Practitioner

## 2024-08-19 ENCOUNTER — Ambulatory Visit: Admitting: Medical

## 2024-08-19 VITALS — BP 110/80 | HR 79 | Temp 97.2°F | Wt 168.0 lb

## 2024-08-19 DIAGNOSIS — Z77011 Contact with and (suspected) exposure to lead: Secondary | ICD-10-CM

## 2024-08-19 DIAGNOSIS — R10A2 Flank pain, left side: Secondary | ICD-10-CM

## 2024-08-19 DIAGNOSIS — S39012A Strain of muscle, fascia and tendon of lower back, initial encounter: Secondary | ICD-10-CM

## 2024-08-19 NOTE — Progress Notes (Signed)
 Subjective:  Donald Mcdonald is a 53 y.o. male who presents for Chief Complaint  Patient presents with   Acute Visit    Mid back pain since last Tuesday. Hurts with movement or sitting or turning side to side, lying down is better. Would like to have a lead test done due to his protein powder he has been taking     Donald Mcdonald is a 53 year old male who presents with left-sided back and flank pain.  He has been experiencing pain in his left middle back and side since last Tuesday about 8 days ago. The pain was initially severe enough to interfere with sleep but has since improved. It moves from the left middle back to the left side and is exacerbated by twisting movements, where he feels a 'pinched' sensation. No specific incident of lifting or twisting is noted, although he resumed a light exercise routine after a few weeks of inactivity.  No pain radiating down his legs or arms, and no associated symptoms such as abdominal pain, nausea, vomiting, or changes in urination or bowel movements. Ibuprofen and heat application have provided some relief. He describes the pain as different from his usual back pain.  He is concerned about potential lead exposure from a protein powder he consumes daily, which was reported to have dangerous levels of lead. His liver and kidney function tests from June were normal, as were his blood counts.  No other aggravating or relieving factors.    No other c/o.  Past Medical History:  Diagnosis Date   Allergy    Seems like seasonal allergies.  Not too severe.   Anxiety    Constipation    Depression    Dr. Medford Croak   Encounter for screening for COVID-19 02/09/2021   Lentigo 03/09/2024   Nevus of neck 03/09/2024   Pain in right axilla 03/14/2022   Skin lesion 02/27/2023   Current Outpatient Medications on File Prior to Visit  Medication Sig Dispense Refill   Acetylcysteine (NAC) 600 MG CAPS      ASHWAGANDHA PO Take by mouth. 450mg      buPROPion  (WELLBUTRIN SR) 150 MG 12 hr tablet Take 150 mg by mouth daily.     COLOSTRUM PO      fluvoxaMINE (LUVOX) 100 MG tablet Take 100 mg by mouth at bedtime.     Glucosamine Sulfate 1000 MG TABS Take 750 mg by mouth 2 (two) times daily.     Lavender Oil 80 MG CAPS      MAGNESIUM PO      Misc Natural Products (TURMERIC CURCUMIN) CAPS      Multiple Vitamin (MULTIVITAMIN) tablet Take 1 tablet by mouth daily.     Omega-3 1000 MG CAPS      Probiotic Product (PROBIOTIC DAILY) CAPS      NON FORMULARY Trametes Versicolor Turkey Tail supplement     No current facility-administered medications on file prior to visit.     The following portions of the patient's history were reviewed and updated as appropriate: allergies, current medications, past family history, past medical history, past social history, past surgical history and problem list.  ROS Otherwise as in subjective above  Objective: BP 110/80   Pulse 79   Temp (!) 97.2 F (36.2 C)   Wt 168 lb (76.2 kg)   SpO2 98%   BMI 23.76 kg/m   General appearance: alert, no distress, well developed, well nourished Lungs: CTA bilaterally, no wheezes, rhonchi, or rales Back: nontender to  palpation but mild pain with rotation of torso or rotational stretching of back to right side, pain felt in left mid back and left flank Abdomen: +bs, soft, non tender, non distended, no masses, no hepatomegaly, no splenomegaly Pulses: 2+ radial pulses, 2+ pedal pulses, normal cap refill Ext: no edema Skin: no bruising, no erythema   Assessment: Encounter Diagnoses  Name Primary?   Back strain, initial encounter Yes   Left flank pain    Lead exposure      Plan: Left-sided musculoskeletal back pain Acute left-sided musculoskeletal back pain likely due to muscle strain. Differential diagnosis muscle strain, includes pancreatitis, kidney stones, shingles, and pneumonia, but these are less likely given the absence of associated symptoms. Pain is improving,  suggesting a musculoskeletal origin. - Advised stretching exercises, including crossed leg stretch and back extensions. - Recommended heat therapy, such as a heat pad or hot towel, and hot showers. - Suggested anti-inflammatory medications like ibuprofen or Aleve for a few days. - Offered muscle relaxers if pain persists, but he declined. - Instructed to monitor symptoms and consider imaging if pain persists beyond 3-4 weeks.  Suspected lead exposure Concern for lead exposure due to protein powder consumption. Previous tests 02/2024 showed normal liver and kidney function, and blood counts, suggesting no significant lead exposure. - Ordered lead level blood test.   Donald Mcdonald was seen today for acute visit.  Diagnoses and all orders for this visit:  Back strain, initial encounter  Left flank pain  Lead exposure -     Lead, blood    Follow up: pending labs

## 2024-08-20 LAB — LEAD, BLOOD (ADULT >= 16 YRS): Lead-Whole Blood: <1 ug/dL (ref 0.0–3.4)

## 2024-08-22 ENCOUNTER — Ambulatory Visit: Payer: Self-pay | Admitting: Medical

## 2024-08-22 NOTE — Progress Notes (Signed)
 Results thru my chart

## 2024-09-28 ENCOUNTER — Encounter: Payer: Self-pay | Admitting: Nurse Practitioner

## 2024-09-29 ENCOUNTER — Ambulatory Visit: Payer: Self-pay | Admitting: Internal Medicine

## 2025-03-19 ENCOUNTER — Encounter: Payer: Self-pay | Admitting: Nurse Practitioner
